# Patient Record
Sex: Male | Born: 1970 | Race: White | Hispanic: No | Marital: Single | Smoking: Former smoker
Health system: Southern US, Community
[De-identification: ages and names within clinical notes are randomized; demographics above are authoritative.]

## PROBLEM LIST (undated history)

## (undated) DIAGNOSIS — M7580 Other shoulder lesions, unspecified shoulder: Secondary | ICD-10-CM

## (undated) DIAGNOSIS — F419 Anxiety disorder, unspecified: Secondary | ICD-10-CM

## (undated) DIAGNOSIS — M419 Scoliosis, unspecified: Secondary | ICD-10-CM

## (undated) DIAGNOSIS — G4730 Sleep apnea, unspecified: Secondary | ICD-10-CM

## (undated) DIAGNOSIS — K219 Gastro-esophageal reflux disease without esophagitis: Secondary | ICD-10-CM

## (undated) DIAGNOSIS — M51379 Other intervertebral disc degeneration, lumbosacral region without mention of lumbar back pain or lower extremity pain: Secondary | ICD-10-CM

## (undated) DIAGNOSIS — K5900 Constipation, unspecified: Secondary | ICD-10-CM

## (undated) DIAGNOSIS — M5137 Other intervertebral disc degeneration, lumbosacral region: Secondary | ICD-10-CM

## (undated) DIAGNOSIS — M549 Dorsalgia, unspecified: Secondary | ICD-10-CM

## (undated) DIAGNOSIS — J329 Chronic sinusitis, unspecified: Secondary | ICD-10-CM

## (undated) DIAGNOSIS — J40 Bronchitis, not specified as acute or chronic: Secondary | ICD-10-CM

## (undated) HISTORY — DX: Constipation, unspecified: K59.00

## (undated) HISTORY — PX: NASAL SINUS SURGERY: SHX719

## (undated) HISTORY — PX: TRACHEOSTOMY: SUR1362

## (undated) HISTORY — DX: Dorsalgia, unspecified: M54.9

## (undated) HISTORY — PX: WISDOM TOOTH EXTRACTION: SHX21

## (undated) HISTORY — DX: Gastro-esophageal reflux disease without esophagitis: K21.9

## (undated) HISTORY — DX: Chronic sinusitis, unspecified: J32.9

## (undated) HISTORY — PX: TONGUE BASE REDUCTION SOMNOPLASTY: SHX2535

## (undated) HISTORY — PX: OTHER SURGICAL HISTORY: SHX169

---

## 2008-07-25 ENCOUNTER — Encounter
Admission: RE | Admit: 2008-07-25 | Discharge: 2008-07-25 | Payer: Self-pay | Admitting: Physical Medicine and Rehabilitation

## 2010-03-31 IMAGING — CT CT L SPINE W/ CM
3 of 7 series · 12 of 27 positions shown, 13 images · non-contrast
Comparison: none

CLINICAL DATA: Low back pain radiating into the right leg.
Degenerative disc disease L4-5 by MRI
TECHNIQUE: Multidetector CT imaging of the lumbar spine was
performed without intravenous contrast administration.  Multiplanar
CT image reconstructions were also generated.

[Series 3: bone windows · axial · 0.27mm/px · z∈[-358,-268]mm · 4 of 62 slices shown, 5 images]
[im 13/62  soft-tissue]
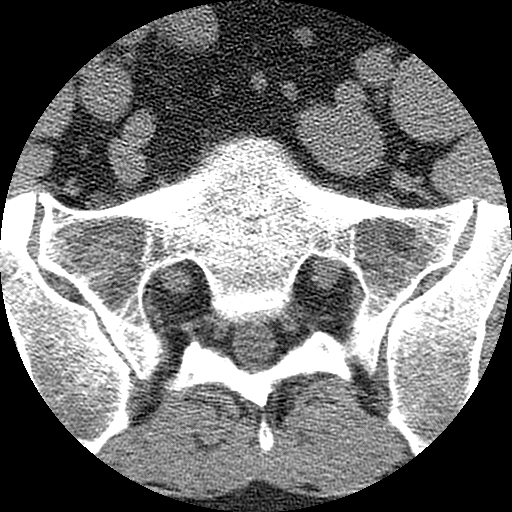
[im 13/62  bone]
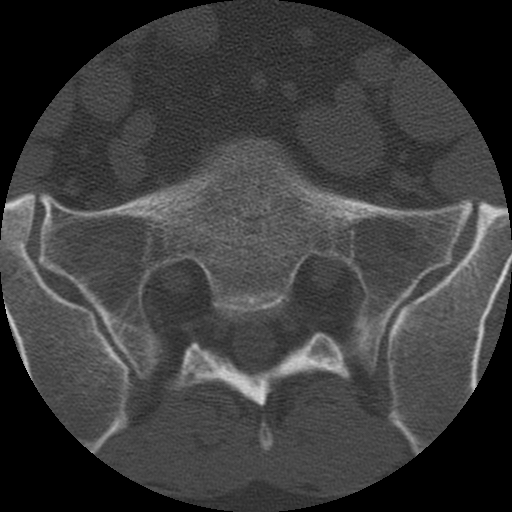
[im 25/62  bone]
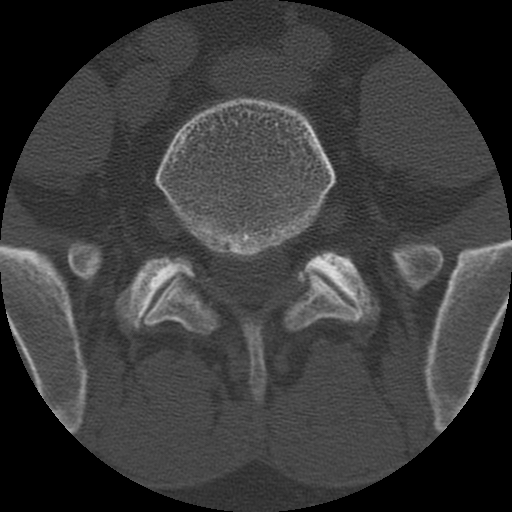
[im 37/62  bone]
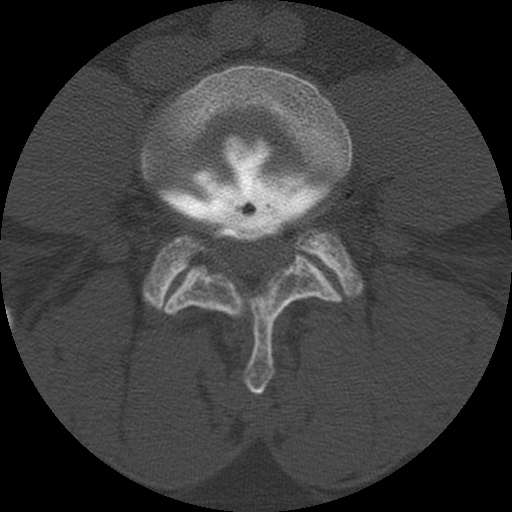
[im 49/62  bone]
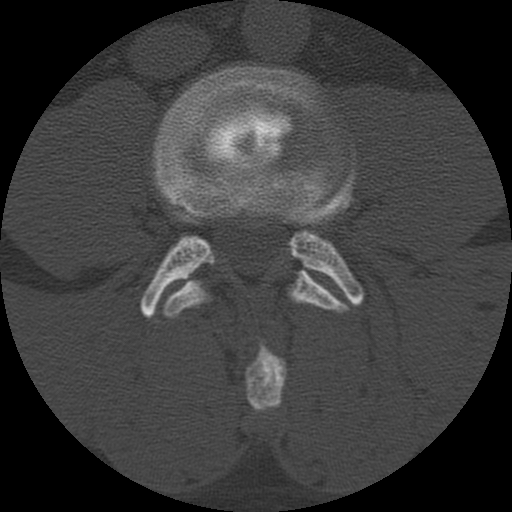

[Series 4: detail windows · axial · 0.27mm/px · z∈[-350,-276]mm · 3 of 62 slices shown]
[im 16/62  bone]
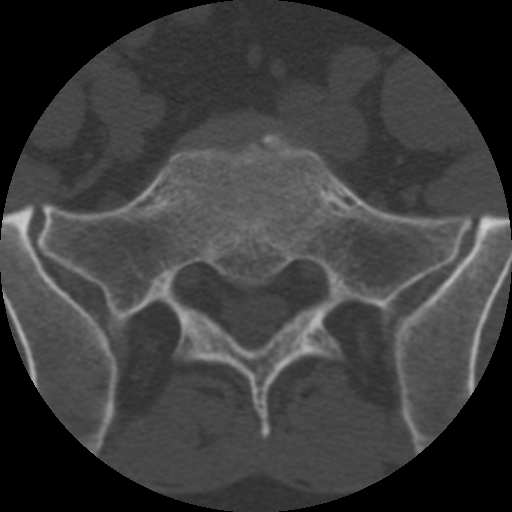
[im 31/62  bone]
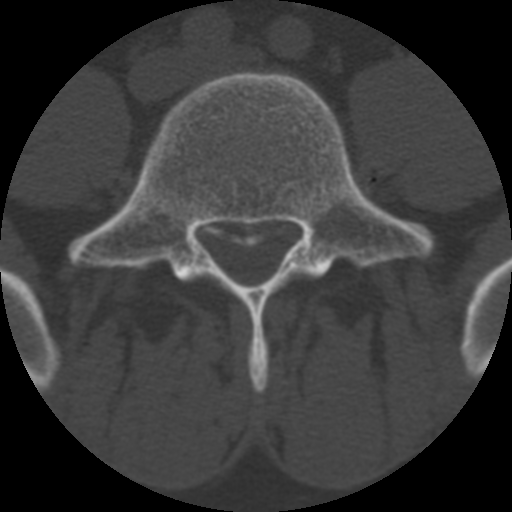
[im 46/62  bone]
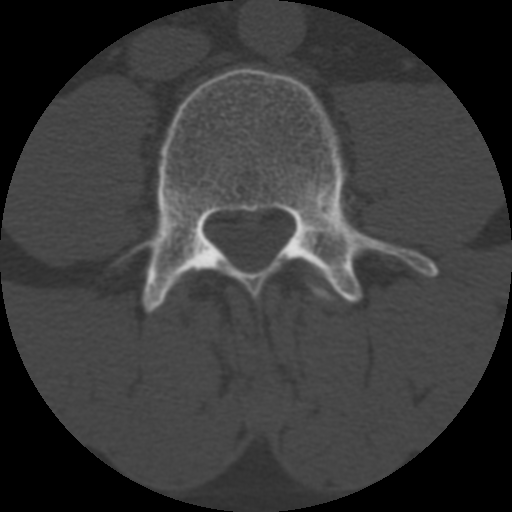

[Series 200: cor · coronal · 0.31mm/px · 5 of 40 slices shown]
[im 7/40  bone]
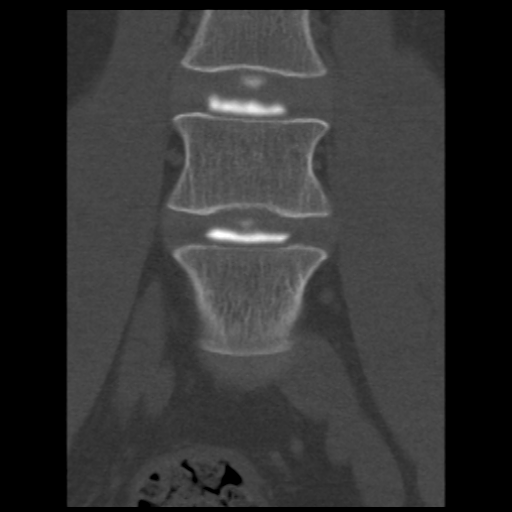
[im 14/40  bone]
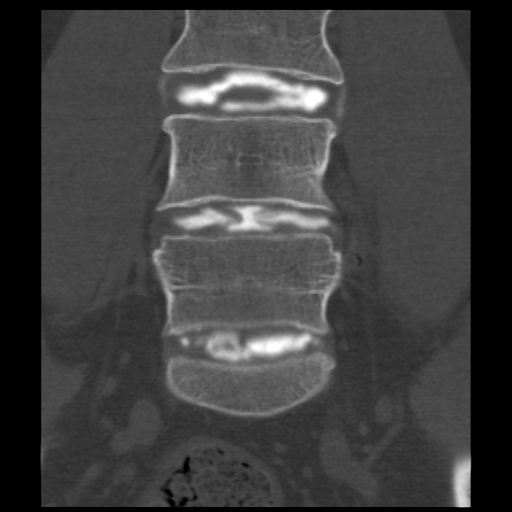
[im 20/40  bone]
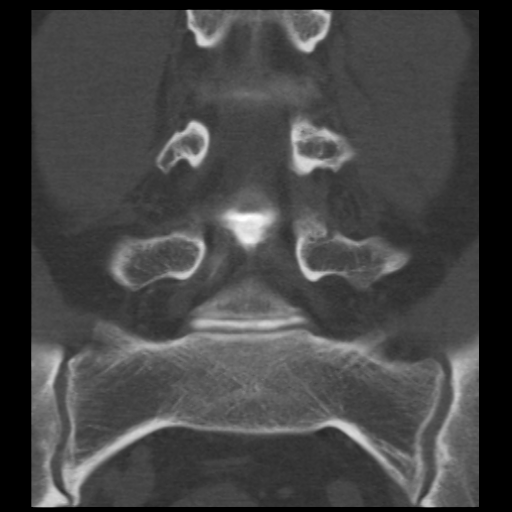
[im 27/40  bone]
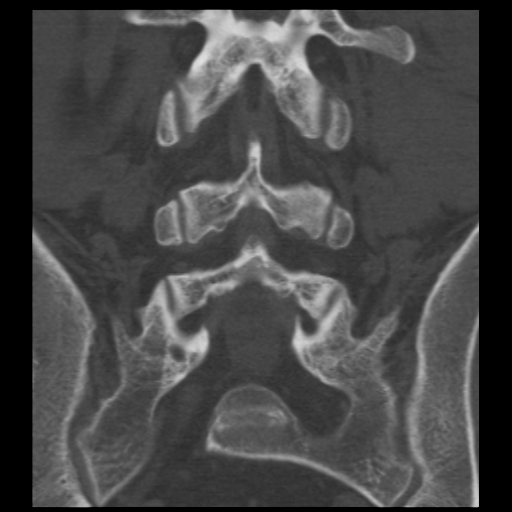
[im 33/40  bone]
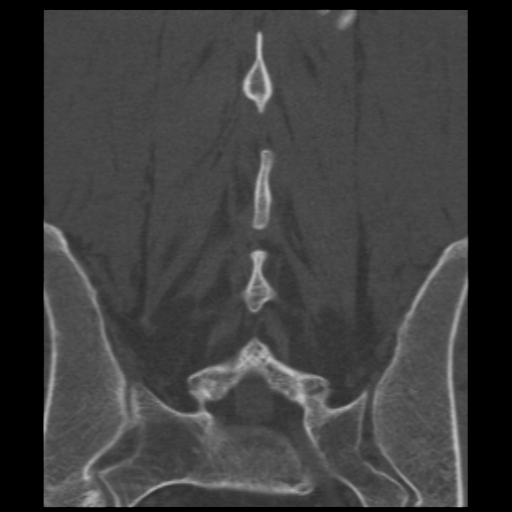

[12 of 27 positions shown; findings below may reference images not displayed]

Fluoroscopy Time: 5 minutes 23 seconds

DIAGNOSTIC LUMBAR DISK INJECTIONS

The procedure was discussed in depth with the patient including the
potential risk of infection. He received 1 gram of Ancef
intravenously prior to the procedure with a small amount withdrawn
and added to the contrast for injection.  He received to mg of
Versed intravenously prior to the procedure.  He was placed prone
on the fluoroscopic table.  A Betadine scrub of the low back was
performed and the patient was draped in a sterile fashion.  Skin
anesthesia was carried [DATE]% Lidocaine. Using a left sided
oblique approach, 15 cm 22 gauge [employer]check [employer] were directed into
the nuclear regions of the disks at L3-4, L4-5 and L5-S1.
Omnipaque 180 mixed with Ancef was injected using a pressure
monitoring syringe.  Spot radiographs were taken. The patient's
symptoms were recorded.  Following the procedure, hewas treated
with 60mg. intravenous Toradol and 944mcg. Intravenous Fentanyl.
He was taken to CT scan in good condition.

L3-4:  Opening pressure was 30 P S I.  The disc accommodated 2 ml
of contrast with elevation of intradiskal pressure to 100 P S I.
There is posterior annular tearing but no extrusion into the
epidural space.  The patient felt pressure but no familiar pain.

L5-S1:  Opening pressure was 40 P S I.  The disc accommodated 1 ml
of contrast with elevation of intradiskal pressure to 100 P S I.
There is posterior annular tearing.  There is mild pressure in the
left hip but no back for right leg pain.

L4-5:  Opening pressure was 10 P S I.  The disc accommodated 2 ml
of contrast with elevation of intradiskal pressure to 100 P S I.
There is posterior annular tearing.  The patient felt concordant
back pain. No leg pain.
IMPRESSION: Posterior annular tearing at all three levels.  Concordant symptoms
only at L4-5.  Familiar back pain but no pain extending into the
leg.

CT LUMBAR SPINE WITHOUT CONTRAST
FINDINGS: L3-4:  Annular tearing from 3 o'clock to 9 o'clock.  No
extrusion of injectate.  Mild bulging of the annulus.

L4-5:  Posterior annular tearing from 5 o'clock to 7 o'clock with
extrusion of injectate into the ventral epidural space.  This
indents the thecal sac.

L5-S1:  Posterior annular tearing from 5 o'clock to 7 o'clock.  No
gross extrusion into the ventral epidural space.  No apparent
compressive effect.
IMPRESSION: Posterior annular tearing at all three levels.  Extrusion of
injectate only at the L4-5 level.

## 2012-08-27 ENCOUNTER — Emergency Department (HOSPITAL_BASED_OUTPATIENT_CLINIC_OR_DEPARTMENT_OTHER)
Admission: EM | Admit: 2012-08-27 | Discharge: 2012-08-27 | Disposition: A | Discharge status: Home / Self Care | Payer: Medicare Other | Attending: Emergency Medicine | Admitting: Emergency Medicine

## 2012-08-27 ENCOUNTER — Encounter (HOSPITAL_BASED_OUTPATIENT_CLINIC_OR_DEPARTMENT_OTHER): Payer: Self-pay | Admitting: *Deleted

## 2012-08-27 DIAGNOSIS — F17200 Nicotine dependence, unspecified, uncomplicated: Secondary | ICD-10-CM | POA: Insufficient documentation

## 2012-08-27 DIAGNOSIS — R51 Headache: Secondary | ICD-10-CM

## 2012-08-27 DIAGNOSIS — J3489 Other specified disorders of nose and nasal sinuses: Secondary | ICD-10-CM

## 2012-08-27 DIAGNOSIS — Z79899 Other long term (current) drug therapy: Secondary | ICD-10-CM | POA: Insufficient documentation

## 2012-08-27 DIAGNOSIS — Z8669 Personal history of other diseases of the nervous system and sense organs: Secondary | ICD-10-CM | POA: Insufficient documentation

## 2012-08-27 DIAGNOSIS — Z8739 Personal history of other diseases of the musculoskeletal system and connective tissue: Secondary | ICD-10-CM | POA: Insufficient documentation

## 2012-08-27 HISTORY — DX: Other intervertebral disc degeneration, lumbosacral region: M51.37

## 2012-08-27 HISTORY — DX: Other intervertebral disc degeneration, lumbosacral region without mention of lumbar back pain or lower extremity pain: M51.379

## 2012-08-27 HISTORY — DX: Scoliosis, unspecified: M41.9

## 2012-08-27 HISTORY — DX: Sleep apnea, unspecified: G47.30

## 2012-08-27 HISTORY — DX: Other shoulder lesions, unspecified shoulder: M75.80

## 2012-08-27 MED ORDER — AZITHROMYCIN 250 MG PO TABS: ORAL_TABLET | ORAL | Status: DC

## 2012-08-27 NOTE — ED Provider Notes (Signed)
History  This chart was scribed for [name]. [name], MD by Shari Heritage, ED Scribe. The patient was seen in room MH06/MH06. Patient's care was started at 2240.  CSN: [id number]  Arrival date & time 08/27/12  2048   First MD Initiated Contact with Patient 08/27/12 2240      Chief Complaint  Patient presents with  . Neck Pain     The history is provided by the patient and a relative. No language interpreter was used.    HPI Comments: Joseph Rogers is a 42 y.o. male who presents to the Emergency Department complaining of sudden onset, improving, mild to moderate, right-sided neck pain that radiates to his head behind his right ear onset 1-2 hours ago. There is associated dizziness. Patient says that pain is aggravated by certain movement and is improved at rest. He also states that he feels a "knot" in his neck. Patient says that he was lying down watching football when the pain began. He states that he was feeling fine prior to this pain episode.  Patient's other symptoms at this time include postnasal drip and nonproductive cough. Patient states that he has bronchitis and is following up with his pulmonologist next week. Patient has been using Flonase upon recommendation from his pulmonologist, but he is not getting significant sinus relief. He has a surgical history of tracheostomy and medical history of sleep apnea. Patient says that he has had tongue tissue removed because his doctor believed it was contributing to sleep apnea.   Past Medical History  Diagnosis Date  . Sleep apnea   . DDD (degenerative disc disease), lumbosacral   . Scoliosis   . AC (acromioclavicular) joint bone spurs     Past Surgical History  Procedure Date  . Tracheostomy     History reviewed. No pertinent family history.  History  Substance Use Topics  . Smoking status: Current Every Day Smoker  . Smokeless tobacco: Not on file  . Alcohol Use: No      Review of Systems  Constitutional: Negative for  fever and chills.  HENT: Positive for ear pain, neck pain and postnasal drip. Negative for hearing loss, sore throat, trouble swallowing, neck stiffness, dental problem, voice change and tinnitus.   Respiratory: Positive for cough.   Cardiovascular: Negative for chest pain.  Gastrointestinal: Negative for nausea and vomiting.  Musculoskeletal: Negative for back pain.  Neurological: Positive for dizziness and headaches. Negative for syncope.    Allergies  Celebrex; Codeine; and Methadone  Home Medications   Current Outpatient Rx  Name  Route  Sig  Dispense  Refill  . DICLOFENAC POTASSIUM 50 MG PO TABS   Oral   Take 50 mg by mouth 3 (three) times daily.         Marland Kitchen FLUTICASONE PROPIONATE 50 MCG/ACT NA SUSP   Nasal   Place 2 sprays into the nose daily.         . OXYCODONE HCL 30 MG PO TABS   Oral   Take 30 mg by mouth every 4 (four) hours as needed.         Marland Kitchen RABEPRAZOLE SODIUM 20 MG PO TBEC   Oral   Take 20 mg by mouth daily.         . AZITHROMYCIN 250 MG PO TABS      Take 2 tablets by mouth on day 1, then one tablets by mouth daily on days 2-5   6 tablet   0     Triage Vitals:  BP 143/91  Pulse 92  Temp 97.9 F (36.6 C) (Oral)  Resp 18  Ht 6\' 3"  (1.905 m)  Wt 260 lb (117.935 kg)  BMI 32.50 kg/m2  SpO2 100%  Physical Exam  Constitutional: He is oriented to person, place, and time. He appears well-developed and well-nourished. No distress.  HENT:  Head: Normocephalic and atraumatic.  Right Ear: Hearing normal. Tympanic membrane is not injected.  Left Ear: Hearing normal. Tympanic membrane is not injected.  Nose: Rhinorrhea present. Right sinus exhibits maxillary sinus tenderness. Left sinus exhibits maxillary sinus tenderness.  Mouth/Throat: Uvula is midline, oropharynx is clear and moist and mucous membranes are normal.       Dry cerumen present in right ear.   Eyes: EOM are normal. Pupils are equal, round, and reactive to light.  Neck: Trachea normal,  normal range of motion and full passive range of motion without pain. Neck supple. No JVD present. No spinous process tenderness present. No Brudzinski's sign and no Kernig's sign noted. No mass present.  Cardiovascular: Normal rate and regular rhythm.   Pulmonary/Chest: Effort normal and breath sounds normal. No stridor.  Musculoskeletal: Normal range of motion.  Neurological: He is alert and oriented to person, place, and time. No cranial nerve deficit. Coordination normal.  Skin: Skin is warm and dry. He is not diaphoretic.  Psychiatric: He has a normal mood and affect. His behavior is normal.    ED Course  Procedures (including critical care time) DIAGNOSTIC STUDIES: Oxygen Saturation is 100% on room air, normal by my interpretation.    COORDINATION OF CARE: 10:50 PM- Patient informed of current plan for treatment and evaluation and agrees with plan at this time.      Labs Reviewed - No data to display No results found.   1. Headache   2. Rhinorrhea       MDM  I personally performed the services described in this documentation, which was scribed in my presence. The recorded information has been reviewed and is accurate.   Pt is well appearing, no fever, no stiff neck, no lymphadenopathy.  Endorses rhinorrhea.  Had HA, non focal neuro exam now.  Gait normal.  Pt given Z pak given his sig prior h/o sinus problems, allergies, HA, and throat difficulties.  Pt is in agreement with plan.  Told to return if worse, and to follow up with PCP next week.    [name]. [name], MD 08/27/12 2337

## 2012-08-27 NOTE — ED Notes (Signed)
Hx ear infection 3 weeks ago. Today, felt pain on right side of neck after going to feed the dogs. Feels knot. Pain radiates up into head. PERL

## 2012-08-27 NOTE — Discharge Instructions (Signed)
Sinus Headache A sinus headache is when your sinuses become clogged or swollen. Sinus headaches can range from mild to severe.  CAUSES A sinus headache can have different causes, such as:  Colds.  Sinus infections.  Allergies. SYMPTOMS  Symptoms of a sinus headache may vary and can include:  Headache.  Pain or pressure in the face.  Congested or runny nose.  Fever.  Inability to smell.  Pain in upper teeth. Weather changes can make symptoms worse. TREATMENT  The treatment of a sinus headache depends on the cause.  Sinus pain caused by a sinus infection may be treated with antibiotic medicine.  Sinus pain caused by allergies may be helped by allergy medicines (antihistamines) and medicated nasal sprays.  Sinus pain caused by congestion may be helped by flushing the nose and sinuses with saline solution. HOME CARE INSTRUCTIONS   If antibiotics are prescribed, take them as directed. Finish them even if you start to feel better.  Only take over-the-counter or prescription medicines for pain, discomfort, or fever as directed by your caregiver.  If you have congestion, use a nasal spray to help reduce pressure. SEEK IMMEDIATE MEDICAL CARE IF:  You have a fever.  You have headaches more than once a week.  You have sensitivity to light or sound.  You have repeated nausea and vomiting.  You have vision problems.  You have sudden, severe pain in your face or head.  You have a seizure.  You are confused.  Your sinus headaches do not get better after treatment. Many people think they have a sinus headache when they actually have migraines or tension headaches. MAKE SURE YOU:   Understand these instructions.  Will watch your condition.  Will get help right away if you are not doing well or get worse. Document Released: 09/10/2004 Document Revised: 10/26/2011 Document Reviewed: 11/01/2010 [employer] Patient Information 2013 [employer].  

## 2013-10-10 ENCOUNTER — Encounter (INDEPENDENT_AMBULATORY_CARE_PROVIDER_SITE_OTHER): Payer: Self-pay

## 2013-10-20 ENCOUNTER — Encounter (INDEPENDENT_AMBULATORY_CARE_PROVIDER_SITE_OTHER): Payer: Self-pay | Admitting: General Surgery

## 2013-10-20 ENCOUNTER — Ambulatory Visit (INDEPENDENT_AMBULATORY_CARE_PROVIDER_SITE_OTHER): Payer: Medicare Other | Admitting: General Surgery

## 2013-10-20 VITALS — BP 134/74 | HR 70 | Temp 98.8°F | Resp 16 | Ht 75.0 in | Wt 255.4 lb

## 2013-10-20 DIAGNOSIS — G8929 Other chronic pain: Secondary | ICD-10-CM

## 2013-10-20 DIAGNOSIS — G4733 Obstructive sleep apnea (adult) (pediatric): Secondary | ICD-10-CM

## 2013-10-20 DIAGNOSIS — G894 Chronic pain syndrome: Secondary | ICD-10-CM

## 2013-10-20 DIAGNOSIS — K4090 Unilateral inguinal hernia, without obstruction or gangrene, not specified as recurrent: Secondary | ICD-10-CM

## 2013-10-20 DIAGNOSIS — M545 Low back pain, unspecified: Secondary | ICD-10-CM

## 2013-10-20 DIAGNOSIS — K5900 Constipation, unspecified: Secondary | ICD-10-CM

## 2013-10-20 DIAGNOSIS — K5909 Other constipation: Secondary | ICD-10-CM

## 2013-10-20 NOTE — Patient Instructions (Signed)
You have a right inguinal hernia. Before repair, need to correct the constipation. I recommend adding one glass of warm prune juice a day, staying well hydrated, and taking MiraLAX once a day. At this to your current regimen. Once the constipation has improved, please call and we can discuss scheduling your right inguinal hernia repair.

## 2013-10-20 NOTE — Progress Notes (Signed)
Patient ID: Joseph PyleDavid Rogers, male   DOB: Mar 14, 1971, 43 y.o.   MRN: [id number]  Chief Complaint  Patient presents with  . New Evaluation    eval RIH / LIH    HPI Joseph Rogers is a 43 y.o. male.   HPI  He is referred by Arnette FeltsMike Duran, PA, for further evaluation of a right inguinal hernia.  He has chronic constipation which has been made worse by being on chronic oxycodone for chronic back pain. He has to strain to have a bowel movement. He has noticed a bulge in the right groin was drained and he is to push back in.  He reports having an ultrasound done which demonstrated a right inguinal hernia. He is here to discuss further treatment. He has tried laxatives for his constipation. When he is well-hydrated, they work. When he is dehydrated, he has a significant problem.  Past Medical History  Diagnosis Date  . Sleep apnea   . DDD (degenerative disc disease), lumbosacral   . Scoliosis   . AC (acromioclavicular) joint bone spurs   . Back pain   . Constipation   . Sinusitis   . GERD (gastroesophageal reflux disease)     Past Surgical History  Procedure Laterality Date  . Tracheostomy      Family History  Problem Relation Age of Onset  . Cancer Father     lung/mesotheliomia    Social History History  Substance Use Topics  . Smoking status: Current Every Day Smoker -- 0.50 packs/day  . Smokeless tobacco: Never Used  . Alcohol Use: No    Allergies  Allergen Reactions  . Celebrex [Celecoxib] Swelling  . Codeine Nausea And Vomiting  . Methadone Other (See Comments)    intolerance    Current Outpatient Prescriptions  Medication Sig Dispense Refill  . clonazePAM (KLONOPIN) 1 MG tablet       . oxycodone (ROXICODONE) 30 MG immediate release tablet Take 30 mg by mouth every 4 (four) hours as needed.       No current facility-administered medications for this visit.    Review of Systems Review of Systems  Constitutional: Negative.   Respiratory: Positive for apnea (He cannot  tolerate a CPAP machine.he sleeps on his side and does well with this.).   Cardiovascular: Negative.   Gastrointestinal: Positive for abdominal pain (Before bowel movement.) and constipation.  Genitourinary: Negative for difficulty urinating.  Musculoskeletal: Positive for arthralgias and back pain.  Neurological: Positive for headaches.    Blood pressure 134/74, pulse 70, temperature 98.8 F (37.1 C), temperature source Oral, resp. rate 16, height 6\' 3"  (1.905 m), weight 255 lb 6.4 oz (115.849 kg).  Physical Exam Physical Exam  Constitutional: No distress.  Overweight male.  HENT:  Head: Normocephalic and atraumatic.  Eyes: No scleral icterus.  Neck:  Lower midline scar  Cardiovascular: Normal rate and regular rhythm.   Pulmonary/Chest: Effort normal and breath sounds normal.  Abdominal: Soft. He exhibits no distension and no mass. There is no tenderness.  Genitourinary:  Right inguinal bulge that is reducible.  No testicular masses. Left inguinal floor it is solid.  Musculoskeletal: He exhibits no edema.  Neurological: He is alert.  Skin: Skin is warm and dry.  Psychiatric:  He is slightly anxious.    Data Reviewed None  Assessment    Right inguinal hernia brought about by worsening constipation. He is still struggling with the constipation. He is on chronic oxycodone which has been exacerbating the condition of constipation.  Plan    I have given him some recommendations regarding the constipation. Once he has this under control, I recommended an open right inguinal hernia repair with mesh with an overnight stay given his sleep apnea.  I have explained the procedure, risks, and aftercare of inguinal hernia repair.  Risks include but are not limited to bleeding, infection, wound problems, anesthesia, recurrence, bladder or intestine injury, urinary retention, testicular dysfunction, chronic pain, mesh problems.  He seems to understand.  I have asked him to call me  once his constipation has improved so we can schedule the surgery.        Jeraldine Primeau J 10/20/2013, 10:53 AM

## 2013-11-07 ENCOUNTER — Telehealth (INDEPENDENT_AMBULATORY_CARE_PROVIDER_SITE_OTHER): Payer: Self-pay

## 2013-11-07 ENCOUNTER — Other Ambulatory Visit (INDEPENDENT_AMBULATORY_CARE_PROVIDER_SITE_OTHER): Payer: Self-pay | Admitting: General Surgery

## 2013-11-07 NOTE — Telephone Encounter (Signed)
Pt made aware orders sent to surgery scheduling.  He is concerned about having to stay overnight for financial reasons.  He is on disability and his monthly income is barely enough to meet his expenses.  He does have a CPAP at home.  Is there any way overnight could be avoided?

## 2013-11-07 NOTE — Telephone Encounter (Signed)
LMOV.  Has his constipation resolved?  If so, we will schedule his surgery.  Dr. Abbey Chattersosenbower made aware.

## 2013-11-07 NOTE — Telephone Encounter (Signed)
If he has a responsible adult to stay with him, he could be an outpatient.

## 2013-11-08 NOTE — Telephone Encounter (Signed)
Pt made aware. He will have his mother and girlfriend staying with him.

## 2013-11-22 ENCOUNTER — Encounter (HOSPITAL_COMMUNITY): Payer: Self-pay | Admitting: Pharmacy Technician

## 2013-11-22 ENCOUNTER — Encounter (INDEPENDENT_AMBULATORY_CARE_PROVIDER_SITE_OTHER): Payer: Self-pay

## 2013-11-30 ENCOUNTER — Encounter (HOSPITAL_COMMUNITY)
Admission: RE | Admit: 2013-11-30 | Discharge: 2013-11-30 | Disposition: A | Discharge status: Home / Self Care | Payer: Medicare Other | Source: Ambulatory Visit | Attending: General Surgery | Admitting: General Surgery

## 2013-11-30 ENCOUNTER — Encounter (HOSPITAL_COMMUNITY): Payer: Self-pay

## 2013-11-30 DIAGNOSIS — Z01812 Encounter for preprocedural laboratory examination: Secondary | ICD-10-CM | POA: Insufficient documentation

## 2013-11-30 HISTORY — DX: Anxiety disorder, unspecified: F41.9

## 2013-11-30 HISTORY — DX: Bronchitis, not specified as acute or chronic: J40

## 2013-11-30 LAB — CBC WITH DIFFERENTIAL/PLATELET
BASOS PCT: 0 % (ref 0–1)
Basophils Absolute: 0 10*3/uL (ref 0.0–0.1)
EOS ABS: 0.2 10*3/uL (ref 0.0–0.7)
EOS PCT: 2 % (ref 0–5)
HCT: 43.8 % (ref 39.0–52.0)
HEMOGLOBIN: 15.2 g/dL (ref 13.0–17.0)
Lymphocytes Relative: 31 % (ref 12–46)
Lymphs Abs: 2.5 10*3/uL (ref 0.7–4.0)
MCH: 29.7 pg (ref 26.0–34.0)
MCHC: 34.7 g/dL (ref 30.0–36.0)
MCV: 85.7 fL (ref 78.0–100.0)
MONO ABS: 0.6 10*3/uL (ref 0.1–1.0)
MONOS PCT: 7 % (ref 3–12)
NEUTROS PCT: 60 % (ref 43–77)
Neutro Abs: 4.9 10*3/uL (ref 1.7–7.7)
Platelets: 175 10*3/uL (ref 150–400)
RBC: 5.11 MIL/uL (ref 4.22–5.81)
RDW: 13.8 % (ref 11.5–15.5)
WBC: 8.2 10*3/uL (ref 4.0–10.5)

## 2013-11-30 LAB — COMPREHENSIVE METABOLIC PANEL
ALBUMIN: 3.9 g/dL (ref 3.5–5.2)
ALT: 24 U/L (ref 0–53)
AST: 20 U/L (ref 0–37)
Alkaline Phosphatase: 71 U/L (ref 39–117)
BUN: 10 mg/dL (ref 6–23)
CALCIUM: 9 mg/dL (ref 8.4–10.5)
CO2: 26 mEq/L (ref 19–32)
CREATININE: 1.05 mg/dL (ref 0.50–1.35)
Chloride: 102 mEq/L (ref 96–112)
GFR calc non Af Amer: 85 mL/min — ABNORMAL LOW (ref >90)
Glucose, Bld: 96 mg/dL (ref 70–99)
POTASSIUM: 4.4 meq/L (ref 3.7–5.3)
Sodium: 141 mEq/L (ref 137–147)
TOTAL PROTEIN: 6.5 g/dL (ref 6.0–8.3)
Total Bilirubin: 0.3 mg/dL (ref 0.3–1.2)

## 2013-11-30 LAB — PROTIME-INR
INR: 1 (ref 0.00–1.49)
PROTHROMBIN TIME: 13 s (ref 11.6–15.2)

## 2013-11-30 NOTE — Pre-Procedure Instructions (Signed)
Joseph Rogers M Conrad  11/30/2013   Your procedure is scheduled on:  Thursday, 12/07/13  [facility] [address] at 9:30 AM.  Call this number if you have problems the morning of surgery: [phone]   Remember:   Do not eat food or drink liquids after midnight.   Take these medicines the morning of surgery with A SIP OF WATER: clonazepam (Klonopin), omeprazole (Prilosec), oxycodone  STOP taking Aspirin, Goody's, BC's, Aleve (Naproxen), Ibuprofen (Advil or Motrin), Fish Oil, Vitamins, Herbal Supplements or any substance that could thin your blood starting today 11/30/13.   Do not wear jewelry.  Do not wear lotions, powders, or colognes. You may wear deodorant.  Do not shave 48 hours prior to surgery. Men may shave face and neck.  Do not bring valuables to the hospital.  [facility] not responsible                  for any belongings or valuables.               Contacts, dentures or bridgework may not be worn into surgery.  Leave suitcase in the car. After surgery it may be brought to your room.  For patients admitted to the hospital, discharge time is determined by your                treatment team.               Patients discharged the day of surgery will not be allowed to drive  home.    Special Instructions: Please use CHG soap the night before surgery and the day of surgery. CHG soap should be used atleast twice.   Please read over the following fact sheets that you were given: Pain Booklet, Coughing and Deep Breathing and Surgical Site Infection Prevention

## 2013-12-04 NOTE — Progress Notes (Signed)
[facility] to send ekg, echo, cxr, last office visit notes to [phone] asap.

## 2013-12-06 MED ORDER — CEFAZOLIN SODIUM-DEXTROSE 2-3 GM-% IV SOLR
2.0000 g | INTRAVENOUS | Status: AC
  Administered 2013-12-07: 2 g via INTRAVENOUS
  Filled 2013-12-06: qty 50

## 2013-12-07 ENCOUNTER — Encounter (HOSPITAL_COMMUNITY): Payer: Medicare Other | Admitting: Anesthesiology

## 2013-12-07 ENCOUNTER — Encounter (HOSPITAL_COMMUNITY): Payer: Self-pay | Admitting: *Deleted

## 2013-12-07 ENCOUNTER — Ambulatory Visit (HOSPITAL_COMMUNITY)
Admission: RE | Admit: 2013-12-07 | Discharge: 2013-12-07 | Disposition: A | Discharge status: Home / Self Care | Payer: Medicare Other | Source: Ambulatory Visit | Attending: General Surgery | Admitting: General Surgery

## 2013-12-07 ENCOUNTER — Encounter (HOSPITAL_COMMUNITY)
Admission: RE | Disposition: A | Discharge status: Home / Self Care | Payer: Self-pay | Source: Ambulatory Visit | Attending: General Surgery

## 2013-12-07 ENCOUNTER — Ambulatory Visit (HOSPITAL_COMMUNITY): Payer: Medicare Other | Admitting: Anesthesiology

## 2013-12-07 DIAGNOSIS — K219 Gastro-esophageal reflux disease without esophagitis: Secondary | ICD-10-CM | POA: Insufficient documentation

## 2013-12-07 DIAGNOSIS — K4090 Unilateral inguinal hernia, without obstruction or gangrene, not specified as recurrent: Secondary | ICD-10-CM

## 2013-12-07 DIAGNOSIS — M4120 Other idiopathic scoliosis, site unspecified: Secondary | ICD-10-CM | POA: Insufficient documentation

## 2013-12-07 DIAGNOSIS — G4730 Sleep apnea, unspecified: Secondary | ICD-10-CM | POA: Insufficient documentation

## 2013-12-07 DIAGNOSIS — G8929 Other chronic pain: Secondary | ICD-10-CM | POA: Insufficient documentation

## 2013-12-07 DIAGNOSIS — F1920 Other psychoactive substance dependence, uncomplicated: Secondary | ICD-10-CM | POA: Insufficient documentation

## 2013-12-07 DIAGNOSIS — F17200 Nicotine dependence, unspecified, uncomplicated: Secondary | ICD-10-CM | POA: Insufficient documentation

## 2013-12-07 DIAGNOSIS — M5137 Other intervertebral disc degeneration, lumbosacral region: Secondary | ICD-10-CM | POA: Insufficient documentation

## 2013-12-07 DIAGNOSIS — M51379 Other intervertebral disc degeneration, lumbosacral region without mention of lumbar back pain or lower extremity pain: Secondary | ICD-10-CM | POA: Insufficient documentation

## 2013-12-07 DIAGNOSIS — F411 Generalized anxiety disorder: Secondary | ICD-10-CM | POA: Insufficient documentation

## 2013-12-07 HISTORY — PX: INGUINAL HERNIA REPAIR: SHX194

## 2013-12-07 HISTORY — PX: INSERTION OF MESH: SHX5868

## 2013-12-07 SURGERY — REPAIR, HERNIA, INGUINAL, ADULT
Anesthesia: Regional | Site: Groin | Laterality: Right

## 2013-12-07 MED ORDER — LIDOCAINE HCL (CARDIAC) 20 MG/ML IV SOLN
INTRAVENOUS | Status: AC
  Filled 2013-12-07: qty 5

## 2013-12-07 MED ORDER — FENTANYL CITRATE 0.05 MG/ML IJ SOLN
INTRAMUSCULAR | Status: AC
  Filled 2013-12-07: qty 5

## 2013-12-07 MED ORDER — LACTATED RINGERS IV SOLN
INTRAVENOUS | Status: DC | PRN
  Administered 2013-12-07 (×2): via INTRAVENOUS

## 2013-12-07 MED ORDER — SUCCINYLCHOLINE CHLORIDE 20 MG/ML IJ SOLN
INTRAMUSCULAR | Status: DC | PRN
  Administered 2013-12-07: 100 mg via INTRAVENOUS

## 2013-12-07 MED ORDER — LIDOCAINE HCL (CARDIAC) 20 MG/ML IV SOLN
INTRAVENOUS | Status: DC | PRN
Start: 2013-12-07 — End: 2013-12-07
  Administered 2013-12-07: 80 mg via INTRAVENOUS

## 2013-12-07 MED ORDER — MIDAZOLAM HCL 2 MG/2ML IJ SOLN
INTRAMUSCULAR | Status: AC
  Administered 2013-12-07: 2 mg
  Filled 2013-12-07: qty 2

## 2013-12-07 MED ORDER — KETOROLAC TROMETHAMINE 30 MG/ML IJ SOLN
INTRAMUSCULAR | Status: AC
  Filled 2013-12-07: qty 1

## 2013-12-07 MED ORDER — LACTATED RINGERS IV SOLN
INTRAVENOUS | Status: DC
  Administered 2013-12-07: 10:00:00 via INTRAVENOUS

## 2013-12-07 MED ORDER — PROMETHAZINE HCL 25 MG/ML IJ SOLN: 6.2500 mg | INTRAMUSCULAR | Status: DC | PRN

## 2013-12-07 MED ORDER — OXYCODONE HCL 5 MG PO TABS: 5.0000 mg | ORAL_TABLET | ORAL | Status: AC | PRN

## 2013-12-07 MED ORDER — PROPOFOL 10 MG/ML IV BOLUS
INTRAVENOUS | Status: AC
  Filled 2013-12-07: qty 20

## 2013-12-07 MED ORDER — HYDROMORPHONE HCL PF 1 MG/ML IJ SOLN: 0.2500 mg | INTRAMUSCULAR | Status: DC | PRN

## 2013-12-07 MED ORDER — KETOROLAC TROMETHAMINE 30 MG/ML IJ SOLN
15.0000 mg | Freq: Once | INTRAMUSCULAR | Status: AC | PRN
  Administered 2013-12-07: 15 mg via INTRAVENOUS

## 2013-12-07 MED ORDER — OXYCODONE HCL 5 MG PO TABS
5.0000 mg | ORAL_TABLET | Freq: Once | ORAL | Status: AC | PRN
  Administered 2013-12-07: 5 mg via ORAL

## 2013-12-07 MED ORDER — PROPOFOL 10 MG/ML IV BOLUS
INTRAVENOUS | Status: DC | PRN
  Administered 2013-12-07: 200 mg via INTRAVENOUS

## 2013-12-07 MED ORDER — KETAMINE HCL 10 MG/ML IJ SOLN
INTRAMUSCULAR | Status: DC | PRN
  Administered 2013-12-07: 50 mg via INTRAVENOUS

## 2013-12-07 MED ORDER — OXYCODONE HCL 5 MG/5ML PO SOLN: 5.0000 mg | Freq: Once | ORAL | Status: AC | PRN

## 2013-12-07 MED ORDER — ONDANSETRON HCL 4 MG/2ML IJ SOLN
INTRAMUSCULAR | Status: DC | PRN
  Administered 2013-12-07: 4 mg via INTRAVENOUS

## 2013-12-07 MED ORDER — 0.9 % SODIUM CHLORIDE (POUR BTL) OPTIME
TOPICAL | Status: DC | PRN
  Administered 2013-12-07: 1000 mL

## 2013-12-07 MED ORDER — MIDAZOLAM HCL 2 MG/2ML IJ SOLN
INTRAMUSCULAR | Status: AC
  Filled 2013-12-07: qty 2

## 2013-12-07 MED ORDER — ARTIFICIAL TEARS OP OINT
TOPICAL_OINTMENT | OPHTHALMIC | Status: AC
  Filled 2013-12-07: qty 3.5

## 2013-12-07 MED ORDER — ACETAMINOPHEN 160 MG/5ML PO SOLN
325.0000 mg | ORAL | Status: DC | PRN
  Filled 2013-12-07: qty 20.3

## 2013-12-07 MED ORDER — KETAMINE HCL 100 MG/ML IJ SOLN
INTRAMUSCULAR | Status: AC
  Filled 2013-12-07: qty 1

## 2013-12-07 MED ORDER — SUCCINYLCHOLINE CHLORIDE 20 MG/ML IJ SOLN
INTRAMUSCULAR | Status: AC
  Filled 2013-12-07: qty 1

## 2013-12-07 MED ORDER — FENTANYL CITRATE 0.05 MG/ML IJ SOLN
INTRAMUSCULAR | Status: DC | PRN
  Administered 2013-12-07 (×2): 50 ug via INTRAVENOUS

## 2013-12-07 MED ORDER — ONDANSETRON HCL 4 MG/2ML IJ SOLN
INTRAMUSCULAR | Status: AC
  Filled 2013-12-07: qty 2

## 2013-12-07 MED ORDER — MIDAZOLAM HCL 5 MG/5ML IJ SOLN
INTRAMUSCULAR | Status: DC | PRN
  Administered 2013-12-07: 2 mg via INTRAVENOUS

## 2013-12-07 MED ORDER — ACETAMINOPHEN 325 MG PO TABS: 325.0000 mg | ORAL_TABLET | ORAL | Status: DC | PRN

## 2013-12-07 MED ORDER — OXYCODONE HCL 5 MG PO TABS
ORAL_TABLET | ORAL | Status: AC
  Filled 2013-12-07: qty 1

## 2013-12-07 MED ORDER — FENTANYL CITRATE 0.05 MG/ML IJ SOLN
INTRAMUSCULAR | Status: AC
  Administered 2013-12-07: 100 ug
  Filled 2013-12-07: qty 2

## 2013-12-07 MED ORDER — BUPIVACAINE 0.5 % ON-Q PUMP SINGLE CATH 100 ML
100.0000 mL | INJECTION | Status: DC
  Filled 2013-12-07: qty 100

## 2013-12-07 MED ORDER — BUPIVACAINE 0.5 % ON-Q PUMP SINGLE CATH 100 ML
INJECTION | Status: DC | PRN
  Administered 2013-12-07: 100 mL

## 2013-12-07 MED ORDER — ARTIFICIAL TEARS OP OINT
TOPICAL_OINTMENT | OPHTHALMIC | Status: DC | PRN
  Administered 2013-12-07: 1 via OPHTHALMIC

## 2013-12-07 MED ORDER — BUPIVACAINE-EPINEPHRINE 0.5% -1:200000 IJ SOLN
INTRAMUSCULAR | Status: DC | PRN
  Administered 2013-12-07: 30 mL

## 2013-12-07 MED ORDER — BUPIVACAINE-EPINEPHRINE (PF) 0.5% -1:200000 IJ SOLN
INTRAMUSCULAR | Status: AC
  Filled 2013-12-07: qty 30

## 2013-12-07 SURGICAL SUPPLY — 49 items
BENZOIN TINCTURE PRP APPL 2/3 (GAUZE/BANDAGES/DRESSINGS) ×3 IMPLANT
BLADE 10 SAFETY STRL DISP (BLADE) ×3 IMPLANT
BLADE 15 SAFETY STRL DISP (BLADE) ×3 IMPLANT
BLADE SURG ROTATE 9660 (MISCELLANEOUS) IMPLANT
CATH KIT ON Q 2.5IN SLV (PAIN MANAGEMENT) ×3 IMPLANT
CHLORAPREP W/TINT 26ML (MISCELLANEOUS) ×3 IMPLANT
CLOSURE WOUND 1/2 X4 (GAUZE/BANDAGES/DRESSINGS) ×1
COVER SURGICAL LIGHT HANDLE (MISCELLANEOUS) ×3 IMPLANT
DRAIN PENROSE 1/2X12 LTX STRL (WOUND CARE) ×3 IMPLANT
DRAPE INCISE IOBAN 66X45 STRL (DRAPES) ×3 IMPLANT
DRAPE LAPAROTOMY TRNSV 102X78 (DRAPE) ×3 IMPLANT
DRAPE UTILITY 15X26 W/TAPE STR (DRAPE) ×6 IMPLANT
DRESSING TELFA 8X3 (GAUZE/BANDAGES/DRESSINGS) ×3 IMPLANT
DRSG OPSITE 6X11 MED (GAUZE/BANDAGES/DRESSINGS) ×3 IMPLANT
ELECT CAUTERY BLADE 6.4 (BLADE) ×3 IMPLANT
ELECT REM PT RETURN 9FT ADLT (ELECTROSURGICAL) ×3
ELECTRODE REM PT RTRN 9FT ADLT (ELECTROSURGICAL) ×1 IMPLANT
GLOVE BIOGEL PI IND STRL 6.5 (GLOVE) ×1 IMPLANT
GLOVE BIOGEL PI IND STRL 8 (GLOVE) ×1 IMPLANT
GLOVE BIOGEL PI INDICATOR 6.5 (GLOVE) ×2
GLOVE BIOGEL PI INDICATOR 8 (GLOVE) ×2
GLOVE ECLIPSE 8.0 STRL XLNG CF (GLOVE) ×3 IMPLANT
GLOVE SS BIOGEL STRL SZ 6.5 (GLOVE) ×1 IMPLANT
GLOVE SUPERSENSE BIOGEL SZ 6.5 (GLOVE) ×2
GLOVE SURG SS PI 6.5 STRL IVOR (GLOVE) ×6 IMPLANT
GOWN STRL REUS W/ TWL LRG LVL3 (GOWN DISPOSABLE) ×3 IMPLANT
GOWN STRL REUS W/TWL LRG LVL3 (GOWN DISPOSABLE) ×6
KIT BASIN OR (CUSTOM PROCEDURE TRAY) ×3 IMPLANT
KIT ROOM TURNOVER OR (KITS) ×3 IMPLANT
MESH HERNIA 3X6 (Mesh General) ×3 IMPLANT
NEEDLE HYPO 25GX1X1/2 BEV (NEEDLE) ×3 IMPLANT
NS IRRIG 1000ML POUR BTL (IV SOLUTION) ×3 IMPLANT
PACK SURGICAL SETUP 50X90 (CUSTOM PROCEDURE TRAY) ×3 IMPLANT
PAD ARMBOARD 7.5X6 YLW CONV (MISCELLANEOUS) ×3 IMPLANT
PENCIL BUTTON HOLSTER BLD 10FT (ELECTRODE) ×3 IMPLANT
SPECIMEN JAR SMALL (MISCELLANEOUS) ×3 IMPLANT
SPONGE GAUZE 4X4 12PLY (GAUZE/BANDAGES/DRESSINGS) ×3 IMPLANT
SPONGE LAP 18X18 X RAY DECT (DISPOSABLE) ×3 IMPLANT
STRIP CLOSURE SKIN 1/2X4 (GAUZE/BANDAGES/DRESSINGS) ×2 IMPLANT
SUT MON AB 4-0 PC3 18 (SUTURE) ×3 IMPLANT
SUT PROLENE 2 0 CT2 30 (SUTURE) ×6 IMPLANT
SUT SILK 2 0 SH (SUTURE) IMPLANT
SUT VIC AB 2-0 SH 18 (SUTURE) ×3 IMPLANT
SUT VIC AB 3-0 SH 27 (SUTURE) ×2
SUT VIC AB 3-0 SH 27XBRD (SUTURE) ×1 IMPLANT
SUT VICRYL AB 3 0 TIES (SUTURE) ×3 IMPLANT
SYR CONTROL 10ML LL (SYRINGE) ×3 IMPLANT
TOWEL OR 17X24 6PK STRL BLUE (TOWEL DISPOSABLE) ×3 IMPLANT
TOWEL OR 17X26 10 PK STRL BLUE (TOWEL DISPOSABLE) ×3 IMPLANT

## 2013-12-07 NOTE — Op Note (Signed)
Preoperative diagnosis:  Right inguinal hernia.  Postoperative diagnosis:  Same  Procedure:  Right inguinal hernia repair with mesh and placement of On Q pain pump  Surgeon:  Avel Peaceodd Kayin Kettering, M.D.  Anesthesia:  General with local (Marcaine).  Indication:  This is a 43 year old male who has right groin pain and a right inguinal hernia. He now presents for repair. The procedure, risks, and after care were discussed with him preoperatively.  Technique:  He was seen in the holding room and the right groin was marked with my initials. He was brought to the operating, placed supine on the operating table, and the anesthetic was administered by the anesthesiologist. The hair in the groin area was clipped as was felt to be necessary. This area was then sterilely prepped and draped.  Local anesthetic was infiltrated in the superficial and deep tissues in the right groin.  An incision was made through the skin and subcutaneous tissue until the external oblique aponeurosis was identified.  Local anesthetic was infiltrated deep to the external oblique aponeurosis. The external oblique aponeurosis was divided through the external ring medially and back toward the anterior superior iliac spine laterally. Using blunt dissection, the shelving edge of the inguinal ligament was identified inferiorly and the internal oblique aponeurosis and muscle were identified superiorly. The ilioinguinal nerve was identified and preserved.  The spermatic cord was isolated and a posterior window was made around it. He had both a direct and indirect hernia defect with extraperitoneal fat herniating through the defects. I excised the extraperitoneal fat herniating through the indirect defect. I reduced the direct defect extraperitoneal fat and loosely approximated the attenuated transversalis fascia muscle to keep the hernia contents reduced.   A piece of 3" x 6" polypropylene mesh was brought into the field and anchored 1-2 cm  medial to the pubic tubercle with 2-0 Prolene suture. The inferior aspect of the mesh was anchored to the shelving edge of the inguinal ligament with running 2-0 Prolene suture to a level 1-2 cm lateral to the internal ring. A slit was cut in the mesh creating 2 tails. These were wrapped around the spermatic cord. The superior aspect of the mesh was anchored to the internal oblique aponeurosis and muscle with interrupted 2-0 Vicryl sutures. The 2 tails of the mesh were then crossed creating a new internal ring and were anchored to the shelving edge of the inguinal ligament with 2-0 Prolene suture. The tip of a hemostat could be placed through the new aperture. The lateral aspect of the mesh was then tucked deep to the external oblique aponeurosis.  The On-Q catheter was then placed through a puncture wound lateral to the large incision so that it would lie on the mesh. It was primed with 0.5%  plain Marcaine solution.  The wound was inspected and hemostasis was adequate. The external oblique aponeurosis was then closed over the mesh and cord with running 3-0 Vicryl suture. The subcutaneous tissue was closed with running 3-0 Vicryl suture. The skin closed with a running 4-0 Monocryl subcuticular stitch.  Steri-Strips and a sterile dressing were applied.  The On-Q catheter was hooked up to the reservoir.  The procedure was well-tolerated without any apparent complications and he was taken to the recovery room in satisfactory condition.

## 2013-12-07 NOTE — H&P (Signed)
Joseph Rogers is an 43 y.o. male.   Chief Complaint:   Here for  Right inguinal hernia (RIH) repair HPI: He has a symptomatic RIH and now presents for elective repair.  Past Medical History  Diagnosis Date  . Sleep apnea   . DDD (degenerative disc disease), lumbosacral   . Scoliosis   . AC (acromioclavicular) joint bone spurs   . Back pain   . Constipation   . Sinusitis   . GERD (gastroesophageal reflux disease)   . Anxiety   . Bronchitis     Pt reports bronchitis after using CPAP.    Past Surgical History  Procedure Laterality Date  . Tracheostomy    . Wisdom tooth extraction    . Nasal sinus surgery    . Tongue base reduction somnoplasty      also removed uvula along with soft tissue, pt has trach for 6 months folllowing surgery.  . Steroid epidural shots    . Ficet injections      Family History  Problem Relation Age of Onset  . Cancer Father     lung/mesotheliomia   Social History:  reports that he has been smoking Cigarettes.  He has been smoking about 0.50 packs per day. He has never used smokeless tobacco. He reports that he does not drink alcohol or use illicit drugs.  Allergies:  Allergies  Allergen Reactions  . Bee Venom Itching and Swelling  . Celebrex [Celecoxib] Swelling  . Codeine Nausea And Vomiting  . Methadone Other (See Comments)    Intolerance; blisters on face and chest    Medications Prior to Admission  Medication Sig Dispense Refill  . clonazePAM (KLONOPIN) 1 MG tablet Take 0.5-1 mg by mouth 2 (two) times daily as needed for anxiety.      Marland Kitchen. omeprazole (PRILOSEC) 20 MG capsule Take 20 mg by mouth daily.      . Oxycodone HCl 20 MG TABS Take 20 mg by mouth 5 (five) times daily.      . polyethylene glycol (MIRALAX / GLYCOLAX) packet Take 34 g by mouth every evening.      . senna (SENOKOT) 8.6 MG TABS tablet Take 4 tablets by mouth every evening.        No results found for this or any previous visit (from the past 48 hour(s)). No results  found.  Review of Systems  Respiratory: Negative.   Cardiovascular: Negative.   Gastrointestinal: Positive for constipation. Negative for abdominal pain.    Blood pressure 141/83, pulse 76, temperature 97.9 F (36.6 C), temperature source Oral, resp. rate 20, weight 250 lb (113.399 kg), SpO2 97.00%. Physical Exam  Constitutional: No distress.  Overweight  HENT:  Head: Normocephalic and atraumatic.  GI: Soft. He exhibits no mass.  Genitourinary:  Reducible right inguinal bulge.     Assessment/Plan Right inguinal hernia  Plan:  Repair of hernia with mesh and placement of On Q pump.  Jim Desanctisodd J Sharley Keeler 12/07/2013, 11:36 AM

## 2013-12-07 NOTE — Anesthesia Procedure Notes (Addendum)
Anesthesia Regional Block:  TAP block  Pre-Anesthetic Checklist: ,, timeout performed, Correct Patient, Correct Site, Correct Laterality, Correct Procedure, Correct Position, risks and benefits discussed, surgical consent, pre-op evaluation,  At surgeon's request and post-op pain management  Laterality: N/A and Right  Prep: chloraprep       Needles:  Injection technique: Single-shot  Needle Type: Echogenic Needle          Additional Needles:  Procedures: ultrasound guided (picture in chart) TAP block Narrative:  Start time: 12/07/2013 11:12 AM End time: 12/07/2013 11:25 AM Injection made incrementally with aspirations every 5 mL.  Performed by: Personally  Anesthesiologist: Moser  Additional Notes: H+P and labs reviewed, risks and benefits discussed with patient, procedure tolerated well without complications   Procedure Name: Intubation Date/Time: 12/07/2013 12:06 PM Performed by: Donette LarryMEYER, Hayzel Ruberg E Pre-anesthesia Checklist: Patient identified, Emergency Drugs available, Suction available, Patient being monitored and Timeout performed Patient Re-evaluated:Patient Re-evaluated prior to inductionOxygen Delivery Method: Circle system utilized Preoxygenation: Pre-oxygenation with 100% oxygen Intubation Type: IV induction Ventilation: Mask ventilation without difficulty Laryngoscope Size: Miller and 3 Grade View: Grade II Tube type: Oral Tube size: 7.5 mm Number of attempts: 1 Airway Equipment and Method: Stylet Secured at: 23 cm Tube secured with: Tape Dental Injury: Teeth and Oropharynx as per pre-operative assessment

## 2013-12-07 NOTE — Transfer of Care (Signed)
Immediate Anesthesia Transfer of Care Note  Patient: [place]  Procedure(s) Performed: Procedure(s): RIGHT INGUINAL HERNIA REPAIR  (Right) INSERTION OF MESH (Right)  Patient Location: PACU  Anesthesia Type:General and Regional  Level of Consciousness: awake, alert , oriented and sedated  Airway & Oxygen Therapy: Patient Spontanous Breathing and Patient connected to nasal cannula oxygen  Post-op Assessment: Report given to PACU RN, Post -op Vital signs reviewed and stable and Patient moving all extremities  Post vital signs: Reviewed and stable  Complications: No apparent anesthesia complications

## 2013-12-07 NOTE — Discharge Instructions (Signed)
[facility] _______[facility]  UMBILICAL OR INGUINAL HERNIA REPAIR: POST OP INSTRUCTIONS  Always review your discharge instruction sheet given to you by the facility where your surgery was performed. IF YOU HAVE DISABILITY OR FAMILY LEAVE FORMS, YOU MUST BRING THEM TO THE OFFICE FOR PROCESSING.   DO NOT GIVE THEM TO YOUR DOCTOR.  1. A  prescription for pain medication may be given to you upon discharge.  Take your pain medication as prescribed, if needed.  If narcotic pain medicine is not needed, then you may take acetaminophen (Tylenol) or ibuprofen (Advil) as needed. 2. Take your usually prescribed medications unless otherwise directed. 3. If you need a refill on your pain medication, please contact your pharmacy.  They will contact our office to request authorization. Prescriptions will not be filled after 5 pm or on week-ends. 4. You should follow a light diet the first 24 hours after arrival home, such as soup and crackers, etc.  Be sure to include lots of fluids daily.  Resume your normal diet the day after surgery. 5. Most patients will experience some swelling and bruising around the umbilicus or in the groin and scrotum.  Ice packs and reclining will help.  Swelling and bruising can take several days to resolve.  6. It is common to experience some constipation if taking pain medication after surgery.  Increasing fluid intake and taking a stool softener (such as Colace) will usually help or prevent this problem from occurring.  A mild laxative (Milk of Magnesia or Miralax) should be taken according to package directions if there are no bowel movements after 48 hours. 7. Unless discharge instructions indicate otherwise, you may remove the pain pump catheter and bandages 4/25 at 5:00 pm, and you may shower at that time.  You may have steri-strips (small skin tapes) in place directly over the incision.  These strips should be left on the skin.  If your surgeon used skin glue on the  incision, you may shower in 24 hours.  The glue will flake off over the next 2-3 weeks.  Any sutures or staples will be removed at the office during your follow-up visit. 8. ACTIVITIES:  You may resume regular (light) daily activities beginning the next day--such as daily self-care, walking, climbing stairs--gradually increasing activities as tolerated.  You may have sexual intercourse when it is comfortable.  Refrain from any heavy lifting or straining (nothing over 10 pounds for 6 weeks). a. You may drive when you are no longer taking prescription pain medication, you can comfortably wear a seatbelt, and you can safely maneuver your car and apply brakes. b. RETURN TO WORK:  _Desk work only in one week.________________________________________________________ 9. You should see your doctor in the office for a follow-up appointment approximately 2-3 weeks after your surgery.  Make sure that you call for this appointment within a day or two after you arrive home to insure a convenient appointment time. 10. OTHER INSTRUCTIONS:  __________________________________________________________________________________________________________________________________________________________________________________________  WHEN TO CALL YOUR DOCTOR: 1. Fever over 101.0 2. Inability to urinate 3. Nausea and/or vomiting 4. Extreme swelling or bruising 5. Continued bleeding from incision. 6. Increased pain, redness, or drainage from the incision  The clinic staff is available to answer your questions during regular business hours.  Please dont hesitate to call and ask to speak to one of the nurses for clinical concerns.  If you have a medical emergency, go to the nearest emergency room or call 911.  A surgeon from [facility] is  always on call at the hospital   [address] [place], [place], [place]  [address] ?  [address], [place],    [address] [phone] ? [phone] ? FAX  [phone] Web site: [email or web address]

## 2013-12-07 NOTE — Anesthesia Preprocedure Evaluation (Signed)
Anesthesia Evaluation  Patient identified by MRN, date of birth, ID band Patient awake    Reviewed: Allergy & Precautions, H&P , NPO status , Patient's Chart, lab work & pertinent test results  History of Anesthesia Complications Negative for: history of anesthetic complications  Airway Mallampati: II TM Distance: >3 FB Neck ROM: Full   Comment: Previous healed trach site Dental  (+) Teeth Intact   Pulmonary sleep apnea and Continuous Positive Airway Pressure Ventilation , neg COPDCurrent Smoker,  breath sounds clear to auscultation        Cardiovascular negative cardio ROS  Rhythm:Regular Rate:Normal     Neuro/Psych PSYCHIATRIC DISORDERS Anxiety Chronic neck and back pain, narcotic dependent    GI/Hepatic Neg liver ROS, GERD-  Medicated and Controlled,  Endo/Other  negative endocrine ROS  Renal/GU negative Renal ROS     Musculoskeletal negative musculoskeletal ROS (+)   Abdominal   Peds  Hematology negative hematology ROS (+)   Anesthesia Other Findings   Reproductive/Obstetrics negative OB ROS                           Anesthesia Physical Anesthesia Plan  ASA: III  Anesthesia Plan: General and Regional   Post-op Pain Management:    Induction: Intravenous  Airway Management Planned: Oral ETT  Additional Equipment: None  Intra-op Plan:   Post-operative Plan: Extubation in OR  Informed Consent: I have reviewed the patients History and Physical, chart, labs and discussed the procedure including the risks, benefits and alternatives for the proposed anesthesia with the patient or authorized representative who has indicated his/her understanding and acceptance.   Dental advisory given  Plan Discussed with: CRNA and Surgeon  Anesthesia Plan Comments:         Anesthesia Quick Evaluation

## 2013-12-11 ENCOUNTER — Telehealth (INDEPENDENT_AMBULATORY_CARE_PROVIDER_SITE_OTHER): Payer: Self-pay | Admitting: General Surgery

## 2013-12-11 MED ORDER — ROPIVACAINE HCL 5 MG/ML IJ SOLN
INTRAMUSCULAR | Status: DC | PRN
  Administered 2013-12-07: 30 mL via PERINEURAL

## 2013-12-11 NOTE — Telephone Encounter (Signed)
Please let him know that his lab work is normal.

## 2013-12-11 NOTE — Anesthesia Postprocedure Evaluation (Signed)
  Anesthesia Post-op Note  Patient: Joseph [place]  Procedure(s) Performed: Procedure(s): RIGHT INGUINAL HERNIA REPAIR  (Right) INSERTION OF MESH (Right)  Patient Location: PACU  Anesthesia Type:GA combined with regional for post-op pain  Level of Consciousness: awake and alert   Airway and Oxygen Therapy: Patient Spontanous Breathing and Patient connected to nasal cannula oxygen  Post-op Pain: none  Post-op Assessment: Post-op Vital signs reviewed, Patient's Cardiovascular Status Stable, Respiratory Function Stable, Patent Airway, No signs of Nausea or vomiting and Pain level controlled  Post-op Vital Signs: Reviewed and stable  Last Vitals:  Filed Vitals:   12/07/13 1515  BP: 136/87  Pulse: 75  Temp: 36.7 C  Resp:     Complications: No apparent anesthesia complications

## 2013-12-11 NOTE — Telephone Encounter (Signed)
Patient want to know about his lab work. And stated that he does feel good. And stated that he was told that a doctor would call him back. He has a follow up with Dr Abbey Chattersosenbower on 12-21-13

## 2013-12-12 ENCOUNTER — Encounter (HOSPITAL_COMMUNITY): Payer: Self-pay | Admitting: General Surgery

## 2013-12-12 NOTE — Telephone Encounter (Signed)
Called patient this morning and told him per Dr Abbey Chattersosenbower that his lab work is normal

## 2013-12-21 ENCOUNTER — Encounter (INDEPENDENT_AMBULATORY_CARE_PROVIDER_SITE_OTHER): Payer: Self-pay | Admitting: General Surgery

## 2013-12-21 ENCOUNTER — Ambulatory Visit (INDEPENDENT_AMBULATORY_CARE_PROVIDER_SITE_OTHER): Payer: Medicare Other | Admitting: General Surgery

## 2013-12-21 VITALS — BP 124/80 | HR 80 | Temp 97.3°F | Resp 14 | Wt 251.8 lb

## 2013-12-21 DIAGNOSIS — Z4889 Encounter for other specified surgical aftercare: Secondary | ICD-10-CM

## 2013-12-21 NOTE — Patient Instructions (Signed)
For pain management, continue your current chronic pain regimen. Use Ibuprofen 600 mg every 6 hours as needed. May also use one extra strength Tylenol every 6 hours. 6 weeks after the date of surgery, may resume normal activities as tolerated, as discussed.

## 2013-12-21 NOTE — Progress Notes (Signed)
He presents for postop followup after open right inguinal pantaloon hernia repair with mesh 2 weeks ago.  He is still having some discomfort but it is improving. Swelling is decreasing. He is voiding and moving his bowels. P.E.  GU:  Incision clean/dry/intact, swelling is moderate, repair is solid.  Assessment:  Progress well 2 weeks post hernia repair.  Plan:  Continue light activities for 6 weeks postop then slowly start to resume normal activities.  Return visit as needed.

## 2015-01-18 DIAGNOSIS — K219 Gastro-esophageal reflux disease without esophagitis: Secondary | ICD-10-CM | POA: Insufficient documentation

## 2015-01-18 DIAGNOSIS — R51 Headache: Secondary | ICD-10-CM | POA: Diagnosis not present

## 2015-01-18 DIAGNOSIS — Z8709 Personal history of other diseases of the respiratory system: Secondary | ICD-10-CM | POA: Insufficient documentation

## 2015-01-18 DIAGNOSIS — M419 Scoliosis, unspecified: Secondary | ICD-10-CM | POA: Diagnosis not present

## 2015-01-18 DIAGNOSIS — Z79899 Other long term (current) drug therapy: Secondary | ICD-10-CM | POA: Diagnosis not present

## 2015-01-18 DIAGNOSIS — R05 Cough: Secondary | ICD-10-CM | POA: Insufficient documentation

## 2015-01-18 DIAGNOSIS — F419 Anxiety disorder, unspecified: Secondary | ICD-10-CM | POA: Insufficient documentation

## 2015-01-18 DIAGNOSIS — Z720 Tobacco use: Secondary | ICD-10-CM | POA: Diagnosis not present

## 2015-01-18 DIAGNOSIS — M542 Cervicalgia: Secondary | ICD-10-CM | POA: Diagnosis present

## 2015-01-19 ENCOUNTER — Emergency Department (HOSPITAL_BASED_OUTPATIENT_CLINIC_OR_DEPARTMENT_OTHER): Payer: Medicare HMO

## 2015-01-19 ENCOUNTER — Emergency Department (HOSPITAL_BASED_OUTPATIENT_CLINIC_OR_DEPARTMENT_OTHER)
Admission: EM | Admit: 2015-01-19 | Discharge: 2015-01-19 | Disposition: A | Discharge status: Home / Self Care | Payer: Medicare HMO | Attending: Emergency Medicine | Admitting: Emergency Medicine

## 2015-01-19 ENCOUNTER — Encounter (HOSPITAL_BASED_OUTPATIENT_CLINIC_OR_DEPARTMENT_OTHER): Payer: Self-pay | Admitting: Emergency Medicine

## 2015-01-19 DIAGNOSIS — R05 Cough: Secondary | ICD-10-CM

## 2015-01-19 DIAGNOSIS — R519 Headache, unspecified: Secondary | ICD-10-CM

## 2015-01-19 DIAGNOSIS — R059 Cough, unspecified: Secondary | ICD-10-CM

## 2015-01-19 DIAGNOSIS — R51 Headache: Secondary | ICD-10-CM

## 2015-01-19 MED ORDER — KETOROLAC TROMETHAMINE 60 MG/2ML IM SOLN
60.0000 mg | Freq: Once | INTRAMUSCULAR | Status: AC
  Administered 2015-01-19: 60 mg via INTRAMUSCULAR
  Filled 2015-01-19: qty 2

## 2015-01-19 MED ORDER — BENZONATATE 100 MG PO CAPS: 100.0000 mg | ORAL_CAPSULE | Freq: Three times a day (TID) | ORAL | Status: AC

## 2015-01-19 MED ORDER — METOCLOPRAMIDE HCL 5 MG/ML IJ SOLN
10.0000 mg | Freq: Once | INTRAMUSCULAR | Status: AC
  Administered 2015-01-19: 10 mg via INTRAMUSCULAR
  Filled 2015-01-19: qty 2

## 2015-01-19 MED ORDER — METOCLOPRAMIDE HCL 10 MG PO TABS: 10.0000 mg | ORAL_TABLET | Freq: Four times a day (QID) | ORAL | Status: AC | PRN

## 2015-01-19 NOTE — ED Notes (Signed)
Patient states that he has generalized pain to his head, neck and back. The patient reports that he has recently been treated for chronic back pain but feels like this is different

## 2015-01-19 NOTE — ED Provider Notes (Signed)
CSN: [id number]     Arrival date & time 01/18/15  2357 History   First MD Initiated Contact with Patient 01/19/15 0044     Chief Complaint  Patient presents with  . Neck Pain     (Consider location/radiation/quality/duration/timing/severity/associated sxs/prior Treatment) Patient is a 44 y.o. male presenting with headaches. The history is provided by the patient.  Headache Pain location:  Generalized Quality:  Dull Radiates to:  Does not radiate Onset quality:  Gradual Duration:  8 weeks Timing:  Constant Progression:  Unchanged Chronicity:  Chronic Context: not activity   Relieved by:  Nothing Worsened by:  Nothing Ineffective treatments: injections in spine for chronic pain. Associated symptoms: no abdominal pain, no blurred vision, no congestion, no diarrhea, no dizziness, no ear pain, no eye pain, no fatigue, no fever, no focal weakness, no loss of balance, no nausea, no neck stiffness, no numbness, no photophobia, no seizures, no tingling, no URI, no visual change, no vomiting and no weakness   Associated symptoms comment:  Also has new onset cough in last several days Risk factors: no anger   On oxycodone 20 which is not helping.  His doctors know about symptoms and one told him to follow up with the other  Past Medical History  Diagnosis Date  . Sleep apnea   . DDD (degenerative disc disease), lumbosacral   . Scoliosis   . AC (acromioclavicular) joint bone spurs   . Back pain   . Constipation   . Sinusitis   . GERD (gastroesophageal reflux disease)   . Anxiety   . Bronchitis     Pt reports bronchitis after using CPAP.   Past Surgical History  Procedure Laterality Date  . Tracheostomy    . Wisdom tooth extraction    . Nasal sinus surgery    . Tongue base reduction somnoplasty      also removed uvula along with soft tissue, pt has trach for 6 months folllowing surgery.  . Steroid epidural shots    . Ficet injections    . Inguinal hernia repair Right 12/07/2013     Procedure: RIGHT INGUINAL HERNIA REPAIR ;  Surgeon: Adolph Pollack, MD;  Location: [facility] OR;  Service: General;  Laterality: Right;  . Insertion of mesh Right 12/07/2013    Procedure: INSERTION OF MESH;  Surgeon: Adolph Pollack, MD;  Location: [facility] OR;  Service: General;  Laterality: Right;   Family History  Problem Relation Age of Onset  . Cancer Father     lung/mesotheliomia   History  Substance Use Topics  . Smoking status: Current Every Day Smoker -- 0.50 packs/day    Types: Cigarettes  . Smokeless tobacco: Never Used  . Alcohol Use: No    Review of Systems  Constitutional: Negative for fever, chills and fatigue.  HENT: Negative for congestion and ear pain.   Eyes: Negative for blurred vision, photophobia and pain.  Respiratory: Negative for choking, chest tightness and shortness of breath.   Cardiovascular: Negative for chest pain and leg swelling.  Gastrointestinal: Negative for nausea, vomiting, abdominal pain and diarrhea.  Musculoskeletal: Negative for neck stiffness.  Neurological: Positive for headaches. Negative for dizziness, focal weakness, seizures, weakness, numbness and loss of balance.  Hematological: Negative for adenopathy.  All other systems reviewed and are negative.     Allergies  Bee venom; Celebrex; Codeine; and Methadone  Home Medications   Prior to Admission medications   Medication Sig Start Date End Date Taking? Authorizing Provider  clonazePAM Scarlette Calico) 1  MG tablet Take 0.5-1 mg by mouth 2 (two) times daily as needed for anxiety.    Historical Provider, MD  omeprazole (PRILOSEC) 20 MG capsule Take 20 mg by mouth daily.    Historical Provider, MD  oxyCODONE (OXY IR/ROXICODONE) 5 MG immediate release tablet Take 1-2 tablets (5-10 mg total) by mouth every 4 (four) hours as needed. 12/07/13   Avel Peaceodd Rosenbower, MD  polyethylene glycol (MIRALAX / GLYCOLAX) packet Take 34 g by mouth every evening.    Historical Provider, MD  senna (SENOKOT) 8.6 MG  TABS tablet Take 4 tablets by mouth every evening.    Historical Provider, MD   BP 124/75 mmHg  Pulse 81  Temp(Src) 97.8 F (36.6 C) (Oral)  Resp 16  Ht 6\' 3"  (1.905 m)  Wt 238 lb (107.956 kg)  BMI 29.75 kg/m2  SpO2 100% Physical Exam  Constitutional: He is oriented to person, place, and time. He appears well-developed and well-nourished. No distress.  Resting comfortably in the room with all the lights on  HENT:  Head: Normocephalic and atraumatic.  Right Ear: External ear normal.  Left Ear: External ear normal.  Mouth/Throat: Oropharynx is clear and moist. No oropharyngeal exudate.  Eyes: Conjunctivae and EOM are normal. Pupils are equal, round, and reactive to light.  Neck: Normal range of motion. Neck supple.  No point tenderness.  No meningismus  Cardiovascular: Normal rate, regular rhythm and intact distal pulses.   Pulmonary/Chest: Effort normal and breath sounds normal. No respiratory distress. He has no wheezes. He has no rales.  Abdominal: Soft. Bowel sounds are normal. There is no tenderness. There is no rebound and no guarding.  Musculoskeletal: Normal range of motion. He exhibits no edema or tenderness.  Lymphadenopathy:    He has no cervical adenopathy.  Neurological: He is alert and oriented to person, place, and time. He has normal reflexes. He displays normal reflexes. No cranial nerve deficit. Coordination normal.  Intact cognition  Skin: Skin is warm and dry.  Psychiatric: He has a normal mood and affect.    ED Course  Procedures (including critical care time) Labs Review Labs Reviewed - No data to display  Imaging Review No results found.   EKG Interpretation None      MDM   Final diagnoses:  Cough   Negative chest Xray and head ct Highly doubt any acute intracranial process.  Follow up with your PMD for ongoing care.      Cy BlamerApril Tayah Idrovo, MD 01/19/15 [phone]

## 2016-01-20 ENCOUNTER — Emergency Department (HOSPITAL_BASED_OUTPATIENT_CLINIC_OR_DEPARTMENT_OTHER)
Admission: EM | Admit: 2016-01-20 | Discharge: 2016-01-20 | Disposition: A | Discharge status: Home / Self Care | Payer: Medicare HMO | Attending: Emergency Medicine | Admitting: Emergency Medicine

## 2016-01-20 ENCOUNTER — Encounter (HOSPITAL_BASED_OUTPATIENT_CLINIC_OR_DEPARTMENT_OTHER): Payer: Self-pay | Admitting: Emergency Medicine

## 2016-01-20 DIAGNOSIS — L905 Scar conditions and fibrosis of skin: Secondary | ICD-10-CM | POA: Diagnosis not present

## 2016-01-20 DIAGNOSIS — Z79891 Long term (current) use of opiate analgesic: Secondary | ICD-10-CM | POA: Diagnosis not present

## 2016-01-20 DIAGNOSIS — R51 Headache: Secondary | ICD-10-CM | POA: Diagnosis present

## 2016-01-20 DIAGNOSIS — Z87891 Personal history of nicotine dependence: Secondary | ICD-10-CM | POA: Insufficient documentation

## 2016-01-20 MED ORDER — METOCLOPRAMIDE HCL 10 MG PO TABS
10.0000 mg | ORAL_TABLET | Freq: Once | ORAL | Status: AC
  Administered 2016-01-20: 10 mg via ORAL
  Filled 2016-01-20: qty 1

## 2016-01-20 MED ORDER — METOCLOPRAMIDE HCL 10 MG PO TABS: 10.0000 mg | ORAL_TABLET | Freq: Four times a day (QID) | ORAL | Status: AC

## 2016-01-20 MED ORDER — METHOCARBAMOL 500 MG PO TABS
1000.0000 mg | ORAL_TABLET | Freq: Once | ORAL | Status: AC
  Administered 2016-01-20: 1000 mg via ORAL
  Filled 2016-01-20: qty 2

## 2016-01-20 NOTE — Discharge Instructions (Signed)
Scar Minimization °You will have a scar anytime you have surgery and a cut is made in the skin or you have something removed from your skin (mole, skin cancer, cyst). Although scars are unavoidable following surgery, there are ways to minimize their appearance. °It is important to follow all the instructions you receive from your caregiver about wound care. How your wound heals will influence the appearance of your scar. If you do not follow the wound care instructions as directed, complications such as infection may occur. Wound instructions include keeping the wound clean, moist, and not letting the wound form a scab. Some people form scars that are raised and lumpy (hypertrophic) or larger than the initial wound (keloidal). °HOME CARE INSTRUCTIONS  °· Follow wound care instructions as directed. °· Keep the wound clean by washing it with soap and water. °· Keep the wound moist with provided antibiotic cream or petroleum jelly until completely healed. Moisten twice a day for about 2 weeks. °· Get stitches (sutures) taken out at the scheduled time. °· Avoid touching or manipulating your wound unless needed. Wash your hands thoroughly before and after touching your wound. °· Follow all restrictions such as limits on exercise or work. This depends on where your scar is located. °· Keep the scar protected from sunburn. Cover the scar with sunscreen/sunblock with SPF 30 or higher. °· Gently massage the scar using a circular motion to help minimize the appearance of the scar. Do this only after the wound has closed and all the sutures have been removed. °· For hypertrophic or keloidal scars, there are several ways to treat and minimize their appearance. Methods include compression therapy, intralesional corticosteroids, laser therapy, or surgery. These methods are performed by your caregiver. °Remember that the scar may appear lighter or darker than your normal skin color. This difference in color should even out with  time. °SEEK MEDICAL CARE IF:  °· You have a fever. °· You develop signs of infection such as pain, redness, pus, and warmth. °· You have questions or concerns. °  °This information is not intended to replace advice given to you by your health care provider. Make sure you discuss any questions you have with your health care provider. °  °Document Released: 01/21/2010 Document Revised: 10/26/2011 Document Reviewed: 03/06/2015 °Elsevier Interactive Patient Education ©2016 [employer] ° °

## 2016-01-20 NOTE — ED Provider Notes (Signed)
CSN: [id number]     Arrival date & time 01/20/16  2217 History  By signing my name below, I, Joseph Rogers, attest that this documentation has been prepared under the direction and in the presence of Joseph Verge, MD. Electronically Signed: Bethel Rogers, ED Scribe. 01/20/2016. 11:26 PM  Chief Complaint  Patient presents with  . Headache   Patient is a 45 y.o. male presenting with rash. The history is provided by the patient. No language interpreter was used.  Rash Location:  Leg Leg rash location:  L lower leg Quality: itchiness   Severity:  Mild Onset quality:  Gradual Duration:  2 months Timing:  Constant Progression:  Improving Chronicity:  Chronic Context: not animal contact   Relieved by: dermatologic treatment. Worsened by:  Nothing tried Ineffective treatments:  None tried Associated symptoms: fatigue and headaches   Associated symptoms: no fever, no myalgias, no nausea and not vomiting    Joseph Rogers is a 45 y.o. male who presents to the Emergency Department complaining of a variably pruritic area at the back of his right leg with onset more than 2 months ago. The area was large, scaly, and pruritic but was "burned off" by his dermatologist and has improved. He has been seen by his PCP for the area and completed a round of amoxicillin that did not improve his symptoms. He is concerned for cellulitis after speaking with a friend who has had similar symptoms in the past.  He also complains of a headache and fatigue that he worries may be signs of infection related to the area on his leg. He is chronically constipated due to pain medication use for chronic back pain.  Pt denies fever, nausea, and vomiting. His PCP is Dr. Linna Rogers.   Past Medical History  Diagnosis Date  . Sleep apnea   . DDD (degenerative disc disease), lumbosacral   . Scoliosis   . AC (acromioclavicular) joint bone spurs   . Back pain   . Constipation   . Sinusitis   . GERD (gastroesophageal reflux  disease)   . Anxiety   . Bronchitis     Pt reports bronchitis after using CPAP.   Past Surgical History  Procedure Laterality Date  . Tracheostomy    . Wisdom tooth extraction    . Nasal sinus surgery    . Tongue base reduction somnoplasty      also removed uvula along with soft tissue, pt has trach for 6 months folllowing surgery.  . Steroid epidural shots    . Ficet injections    . Inguinal hernia repair Right 12/07/2013    Procedure: RIGHT INGUINAL HERNIA REPAIR ;  Surgeon: Adolph Pollack, MD;  Location: [facility] OR;  Service: General;  Laterality: Right;  . Insertion of mesh Right 12/07/2013    Procedure: INSERTION OF MESH;  Surgeon: Adolph Pollack, MD;  Location: [facility] OR;  Service: General;  Laterality: Right;   Family History  Problem Relation Age of Onset  . Cancer Father     lung/mesotheliomia   Social History  Substance Use Topics  . Smoking status: Former Smoker -- 0.00 packs/day  . Smokeless tobacco: Never Used  . Alcohol Use: No    Review of Systems  Constitutional: Positive for fatigue. Negative for fever.  Eyes: Negative for photophobia.  Cardiovascular: Negative for chest pain.  Gastrointestinal: Negative for nausea and vomiting.  Musculoskeletal: Negative for myalgias, neck pain and neck stiffness.  Skin: Positive for rash.  An itchy area at the posterior left leg   Neurological: Positive for headaches. Negative for dizziness, facial asymmetry, weakness, light-headedness and numbness.  All other systems reviewed and are negative.  Allergies  Bee venom; Celebrex; Codeine; and Methadone  Home Medications   Prior to Admission medications   Medication Sig Start Date End Date Taking? Authorizing Provider  oxyCODONE (ROXICODONE) 15 MG immediate release tablet Take 20 mg by mouth 4 (four) times daily.   Yes Historical Provider, MD  benzonatate (TESSALON) 100 MG capsule Take 1 capsule (100 mg total) by mouth every 8 (eight) hours. 01/19/15   Chanel Mcadams, MD   clonazePAM (KLONOPIN) 1 MG tablet Take 0.5-1 mg by mouth 2 (two) times daily as needed for anxiety.    Historical Provider, MD  metoCLOPramide (REGLAN) 10 MG tablet Take 1 tablet (10 mg total) by mouth every 6 (six) hours as needed for nausea. 01/19/15   Jorden Mahl, MD  omeprazole (PRILOSEC) 20 MG capsule Take 20 mg by mouth daily.    Historical Provider, MD  oxyCODONE (OXY IR/ROXICODONE) 5 MG immediate release tablet Take 1-2 tablets (5-10 mg total) by mouth every 4 (four) hours as needed. 12/07/13   Avel Peaceodd Rosenbower, MD  polyethylene glycol (MIRALAX / GLYCOLAX) packet Take 34 g by mouth every evening.    Historical Provider, MD  senna (SENOKOT) 8.6 MG TABS tablet Take 4 tablets by mouth every evening.    Historical Provider, MD   BP 149/90 mmHg  Pulse 75  Temp(Src) 98.6 F (37 C) (Oral)  Resp 18  Ht 6\' 5"  (1.956 m)  Wt 240 lb (108.863 kg)  BMI 28.45 kg/m2  SpO2 98% Physical Exam  Constitutional: He is oriented to person, place, and time. He appears well-developed and well-nourished.  Well appearing sitting calmly in a chair will all the lights on  HENT:  Head: Normocephalic and atraumatic.  Mouth/Throat: Oropharynx is clear and moist.  Moist mucous membranes No exudate  Eyes: Pupils are equal, round, and reactive to light.  Neck: Normal range of motion. Neck supple. No rigidity. Normal range of motion present. No Brudzinski's sign and no Kernig's sign noted.  Trachea midline  Cardiovascular: Normal rate, regular rhythm and intact distal pulses.   Pulmonary/Chest: Effort normal and breath sounds normal. No stridor. No respiratory distress. He has no wheezes. He has no rales.  CTAB  Abdominal: Soft. Bowel sounds are normal. He exhibits no mass. There is no tenderness. There is no rebound and no guarding.  Musculoskeletal: Normal range of motion.  Muscle spasm at the left trapezius   Lymphadenopathy:    He has no cervical adenopathy.  Neurological: He is alert and oriented to  person, place, and time. He has normal reflexes. He displays normal reflexes. No cranial nerve deficit. He exhibits normal muscle tone.  Skin: Skin is warm and dry.  Psychiatric: He has a normal mood and affect. His behavior is normal.  Nursing note and vitals reviewed.   ED Course  Procedures (including critical care time) DIAGNOSTIC STUDIES: Oxygen Saturation is 98% on RA,  normal by my interpretation.    COORDINATION OF CARE: 11:12 PM Discussed treatment plan which includes Reglan and Robaxin with pt at bedside and pt agreed to plan.   MDM   Final diagnoses:  None   Filed Vitals:   01/20/16 2222  BP: 149/90  Pulse: 75  Temp: 98.6 F (37 C)  Resp: 18   Medications  metoCLOPramide (REGLAN) tablet 10 mg (10 mg Oral Given  01/20/16 2329)  methocarbamol (ROBAXIN) tablet 1,000 mg (1,000 mg Oral Given 01/20/16 2329)    Headache and vague symptoms for weeks.  Sot on his leg is definitively a scar from having a growth burned off.  There is NO cellulitis.  Neck is supple patient is well appearing with normal exam and vital signs.  He has a follow up with his PMD.  He has had no instrumentation no fevers.  Strict return precautions given  I personally performed the services described in this documentation, which was scribed in my presence. The recorded information has been reviewed and is accurate.     Cy Blamer, MD 01/20/16 2349

## 2016-01-20 NOTE — ED Notes (Signed)
Patient reports that he has a spot on his leg that he had the dermatologist burn, but he talked to a friend today and thought that because she had cellulitis that he might as well. The patient reports that he has had fatigue and lack of engery, with HA and neck stiffness for 2 months, and he feels like this is all related/

## 2016-09-24 IMAGING — CT CT HEAD W/O CM
1 series · 16 of 30 positions shown, 20 images · non-contrast
Comparison: None.

CLINICAL DATA: Generalized pain to the head, neck and back.
Headache for 2 months.

EXAM:
CT HEAD WITHOUT CONTRAST
TECHNIQUE: Contiguous axial images were obtained from the base of the skull
through the vertex without intravenous contrast.

[Series 2: head 4.8 h37s · axial · 0.48mm/px · z∈[-182,-30]mm · 16 of 36 slices shown, 20 images]
[im 2/36  brain]
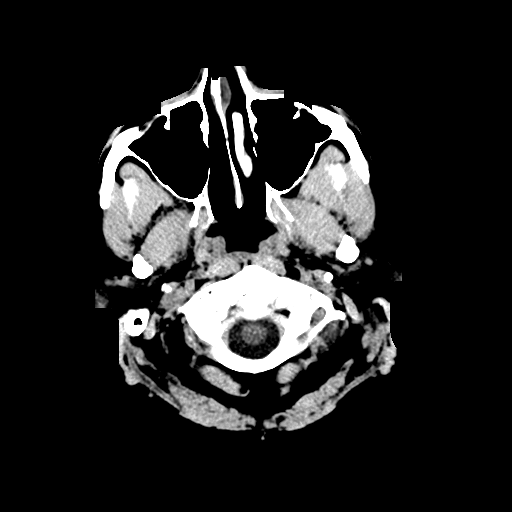
[im 2/36  bone]
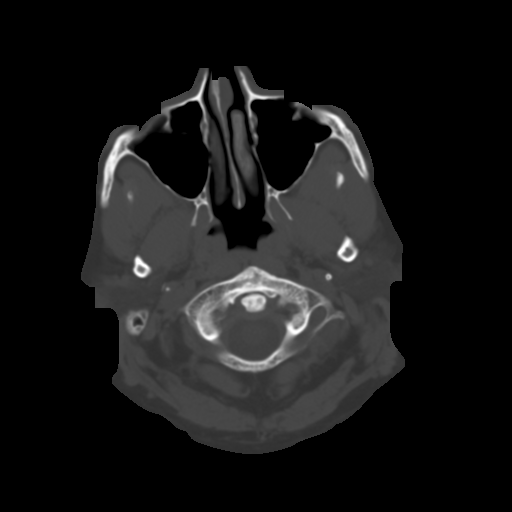
[im 4/36  brain]
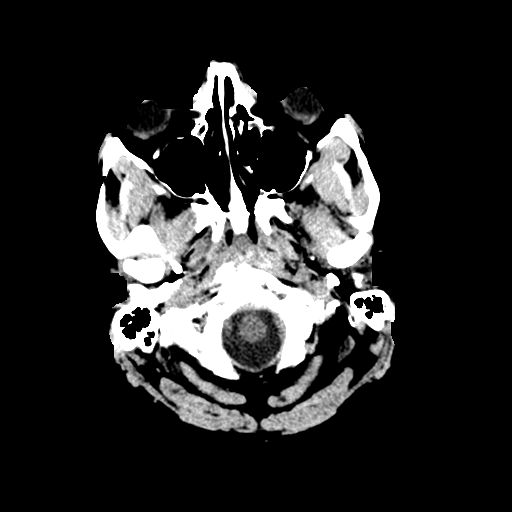
[im 7/36  brain]
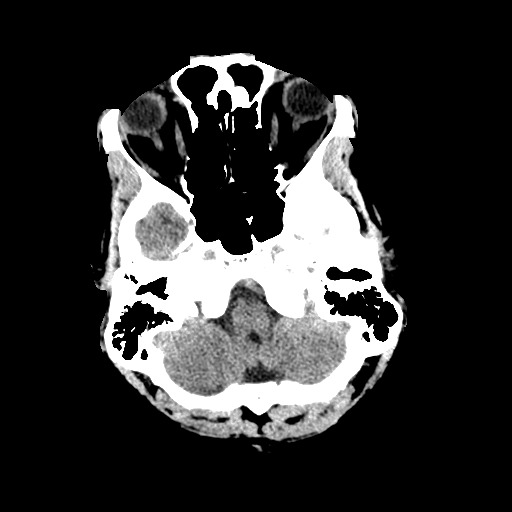
[im 9/36  brain]
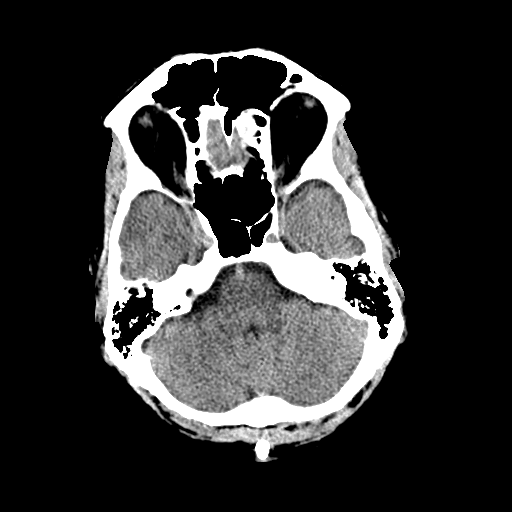
[im 10/36  brain]
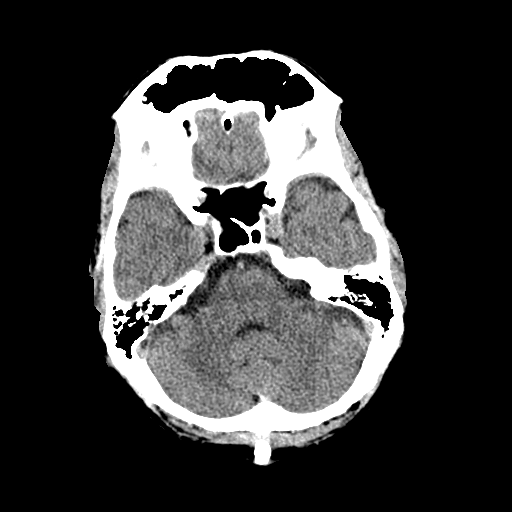
[im 10/36  bone]
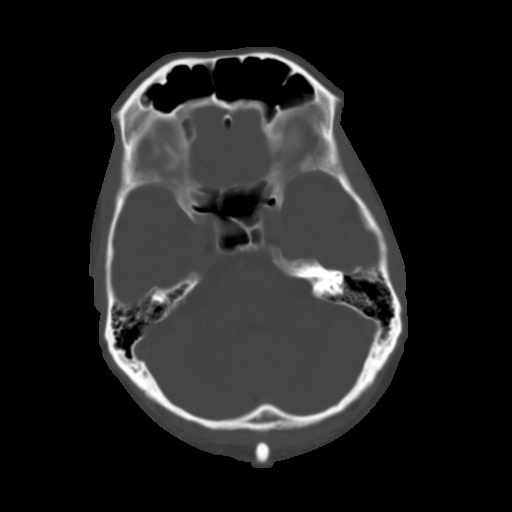
[im 13/36  brain]
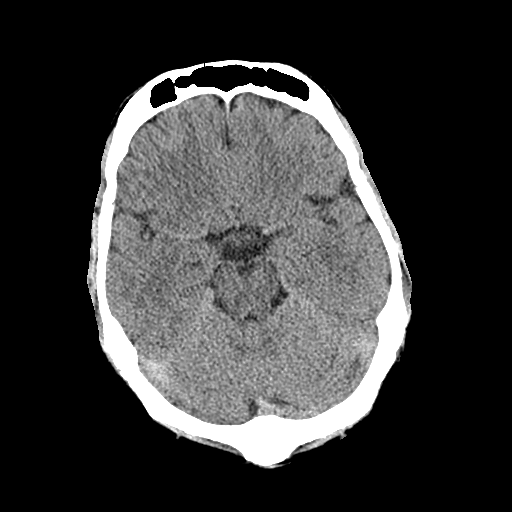
[im 15/36  brain]
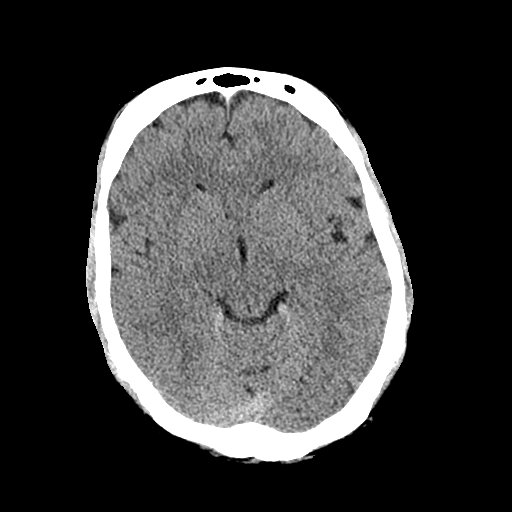
[im 17/36  brain]
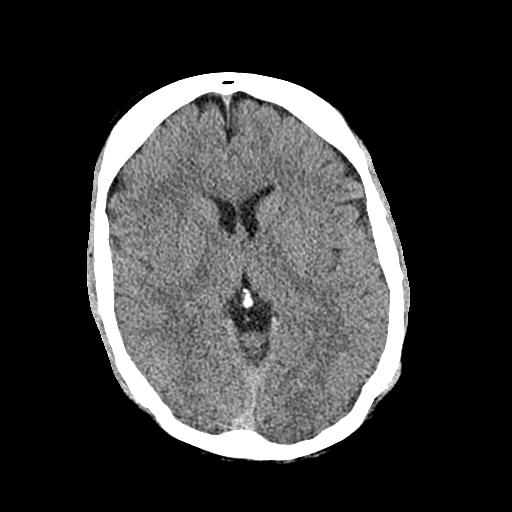
[im 19/36  brain]
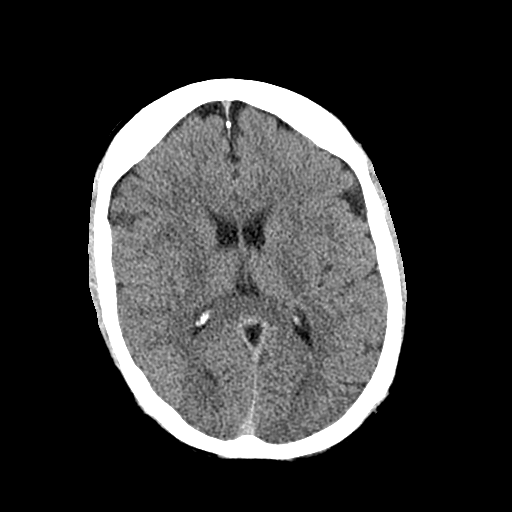
[im 19/36  bone]
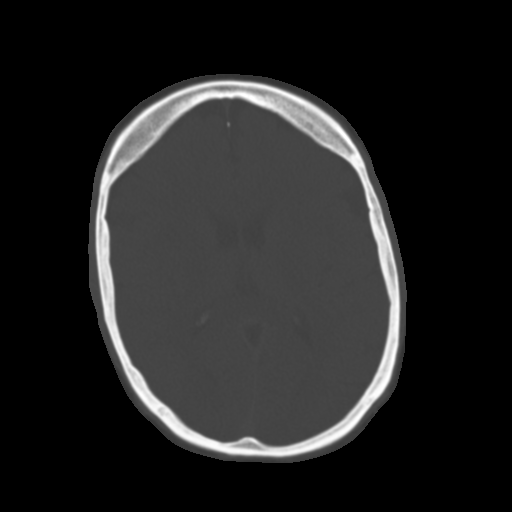
[im 21/36  brain]
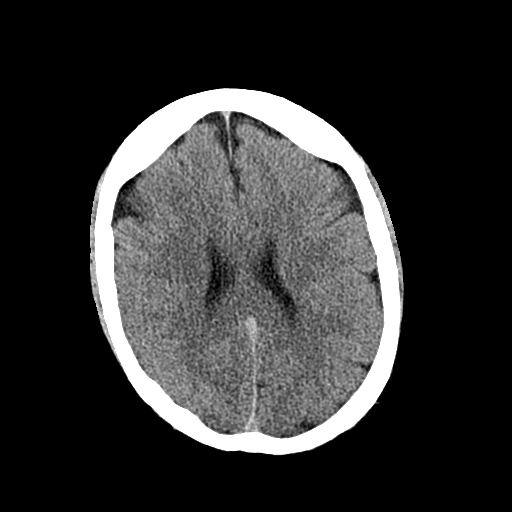
[im 23/36  brain]
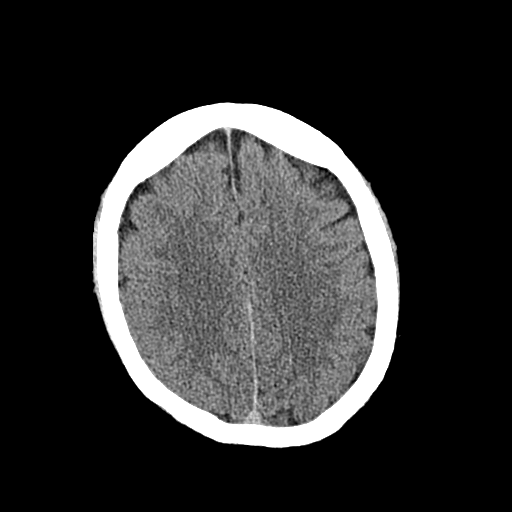
[im 26/36  brain]
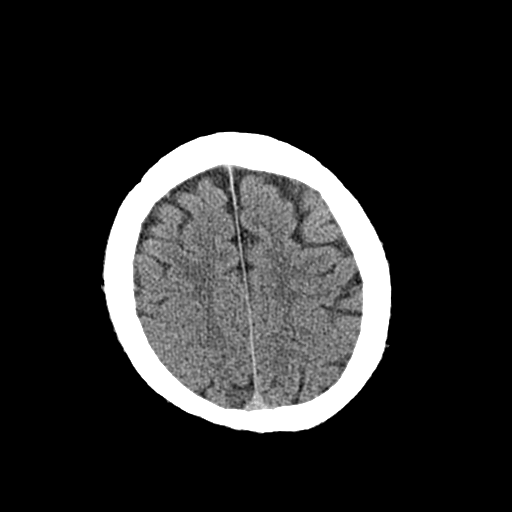
[im 27/36  brain]
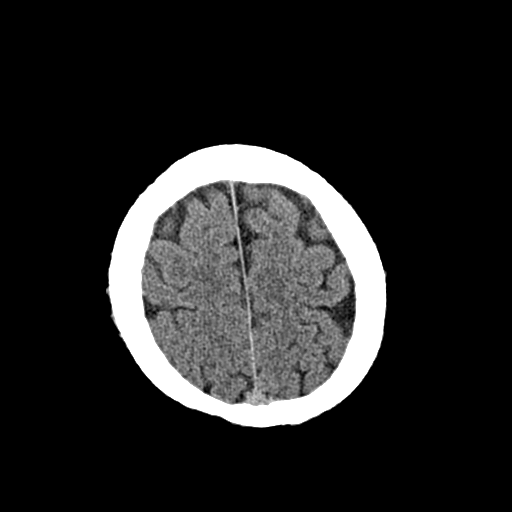
[im 27/36  bone]
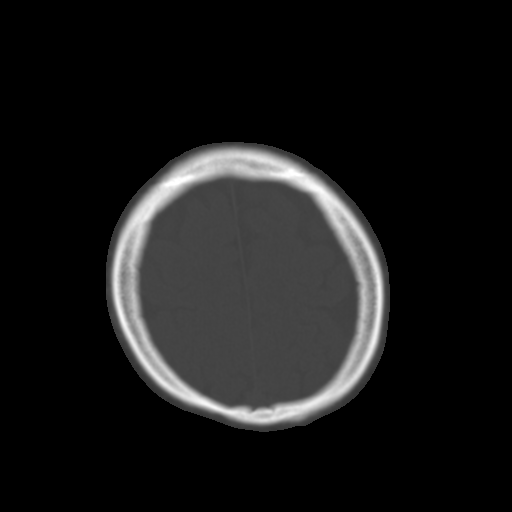
[im 29/36  brain]
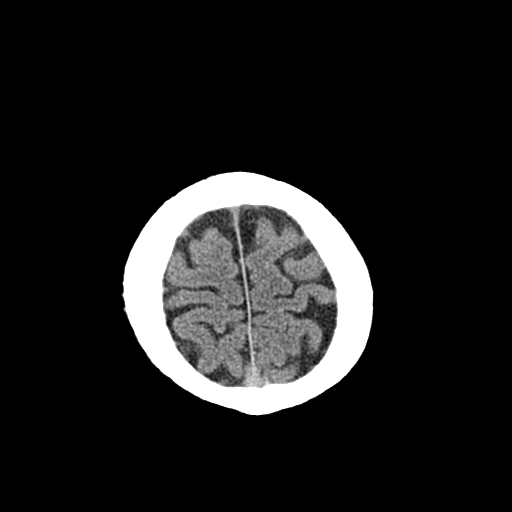
[im 32/36  brain]
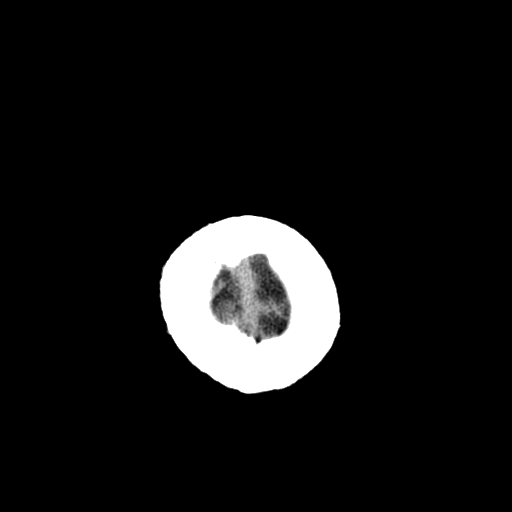
[im 34/36  brain]
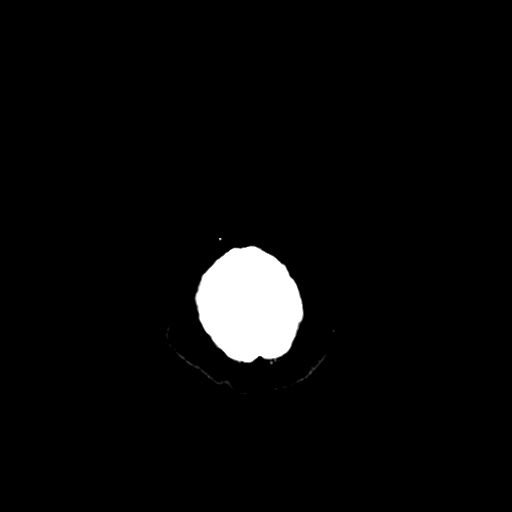

[16 of 30 positions shown; findings below may reference images not displayed]

FINDINGS: No intracranial hemorrhage, mass effect, or midline shift. No
hydrocephalus. The basilar cisterns are patent. No evidence of
territorial infarct. No intracranial fluid collection. Calvarium is
intact. Minimal mucosal thickening in the left sphenoid sinus, the
paranasal sinuses and mastoid air cells are otherwise well aerated.
IMPRESSION: No acute intracranial abnormality.

## 2018-02-21 ENCOUNTER — Other Ambulatory Visit: Payer: Self-pay

## 2018-02-21 ENCOUNTER — Encounter (HOSPITAL_BASED_OUTPATIENT_CLINIC_OR_DEPARTMENT_OTHER): Payer: Self-pay

## 2018-02-21 ENCOUNTER — Emergency Department (HOSPITAL_BASED_OUTPATIENT_CLINIC_OR_DEPARTMENT_OTHER)
Admission: EM | Admit: 2018-02-21 | Discharge: 2018-02-21 | Disposition: A | Discharge status: Home / Self Care | Payer: Medicare HMO | Attending: Emergency Medicine | Admitting: Emergency Medicine

## 2018-02-21 DIAGNOSIS — S61512A Laceration without foreign body of left wrist, initial encounter: Secondary | ICD-10-CM | POA: Diagnosis not present

## 2018-02-21 DIAGNOSIS — Y9389 Activity, other specified: Secondary | ICD-10-CM | POA: Insufficient documentation

## 2018-02-21 DIAGNOSIS — Y929 Unspecified place or not applicable: Secondary | ICD-10-CM | POA: Diagnosis not present

## 2018-02-21 DIAGNOSIS — Y999 Unspecified external cause status: Secondary | ICD-10-CM | POA: Insufficient documentation

## 2018-02-21 DIAGNOSIS — Z79899 Other long term (current) drug therapy: Secondary | ICD-10-CM | POA: Insufficient documentation

## 2018-02-21 DIAGNOSIS — W268XXA Contact with other sharp object(s), not elsewhere classified, initial encounter: Secondary | ICD-10-CM | POA: Insufficient documentation

## 2018-02-21 DIAGNOSIS — Z87891 Personal history of nicotine dependence: Secondary | ICD-10-CM | POA: Diagnosis not present

## 2018-02-21 MED ORDER — LIDOCAINE HCL (PF) 1 % IJ SOLN
10.0000 mL | Freq: Once | INTRAMUSCULAR | Status: AC
  Administered 2018-02-21: 10 mL via INTRADERMAL
  Filled 2018-02-21: qty 10

## 2018-02-21 MED ORDER — TETANUS-DIPHTH-ACELL PERTUSSIS 5-2.5-18.5 LF-MCG/0.5 IM SUSP
0.5000 mL | Freq: Once | INTRAMUSCULAR | Status: DC
  Filled 2018-02-21: qty 0.5

## 2018-02-21 NOTE — ED Provider Notes (Signed)
[facility] EMERGENCY DEPARTMENT Provider Note   CSN: [id number] Arrival date & time: 02/21/18  [id number]     History   Chief Complaint Chief Complaint  Patient presents with  . Laceration    HPI Joseph Rogers is a 47 y.o. male.  The history is provided by the patient. No language interpreter was used.  Laceration   The incident occurred less than 1 hour ago. The laceration is located on the left arm. The laceration is 1 cm in size. The laceration mechanism was a a razor. The pain is at a severity of 2/10. The pain is mild. The pain has been intermittent since onset. He reports no foreign bodies present. His tetanus status is unknown.  Is a 47 year old male who presents for evaluation of laceration of the left wrist.  Patient states that he was working placing a block in a door and had to cut the wood.  He had his wrist up top and he slipped and caused a small cut in the left wrist.  He states that it was bleeding a lot earlier and his coworker was very concerned he might of hit his artery.  He states the bleeding is completely resolved.  He denies any numbness, tingling, weakness.  He is right-hand dominant.  Past Medical History:  Diagnosis Date  . AC (acromioclavicular) joint bone spurs   . Anxiety   . Back pain   . Bronchitis    Pt reports bronchitis after using CPAP.  Marland Kitchen. Constipation   . DDD (degenerative disc disease), lumbosacral   . GERD (gastroesophageal reflux disease)   . Scoliosis   . Sinusitis   . Sleep apnea     Patient Active Problem List   Diagnosis Date Noted  . Inguinal hernia without mention of obstruction or gangrene, unilateral or unspecified, (not specified as recurrent)-right 10/20/2013  . Chronic constipation 10/20/2013  . Chronic pain syndrome 10/20/2013  . OSA (obstructive sleep apnea) 10/20/2013  . Chronic low back pain 10/20/2013    Past Surgical History:  Procedure Laterality Date  . Ficet injections    . INGUINAL HERNIA REPAIR Right  12/07/2013   Procedure: RIGHT INGUINAL HERNIA REPAIR ;  Surgeon: Adolph Pollackodd J Rosenbower, MD;  [facility];  Service: General;  Laterality: Right;  . INSERTION OF MESH Right 12/07/2013   Procedure: INSERTION OF MESH;  Surgeon: Adolph Pollackodd J Rosenbower, MD;  [facility];  Service: General;  Laterality: Right;  . NASAL SINUS SURGERY    . Steroid epidural shots    . TONGUE BASE REDUCTION SOMNOPLASTY     also removed uvula along with soft tissue, pt has trach for 6 months folllowing surgery.  . TRACHEOSTOMY    . WISDOM TOOTH EXTRACTION          Home Medications    Prior to Admission medications   Medication Sig Start Date End Date Taking? Authorizing Provider  benzonatate (TESSALON) 100 MG capsule Take 1 capsule (100 mg total) by mouth every 8 (eight) hours. 01/19/15  Yes Palumbo, April, MD  Buprenorphine HCl-Naloxone HCl (ZUBSOLV) 5.7-1.4 MG SUBL Place under the tongue.   Yes [provider]  clonazePAM (KLONOPIN) 1 MG tablet Take 0.5-1 mg by mouth 2 (two) times daily as needed for anxiety.   Yes [provider]  metoCLOPramide (REGLAN) 10 MG tablet Take 1 tablet (10 mg total) by mouth every 6 (six) hours as needed for nausea. 01/19/15  Yes Palumbo, April, MD  metoCLOPramide (REGLAN) 10 MG tablet Take  1 tablet (10 mg total) by mouth every 6 (six) hours. 01/20/16  Yes Palumbo, April, MD  omeprazole (PRILOSEC) 20 MG capsule Take 20 mg by mouth daily.   Yes [provider]  oxyCODONE (OXY IR/ROXICODONE) 5 MG immediate release tablet Take 1-2 tablets (5-10 mg total) by mouth every 4 (four) hours as needed. 12/07/13  Yes Avel Peace, MD  oxyCODONE (ROXICODONE) 15 MG immediate release tablet Take 20 mg by mouth 4 (four) times daily.   Yes [provider]  polyethylene glycol (MIRALAX / GLYCOLAX) packet Take 34 g by mouth every evening.   Yes [provider]  senna (SENOKOT) 8.6 MG TABS tablet Take 4 tablets by mouth every evening.   Yes [provider]      Family History Family History  Problem Relation Age of Onset  . Cancer Father        lung/mesotheliomia    Social History Social History   Tobacco Use  . Smoking status: Former Smoker    Packs/day: 0.00  . Smokeless tobacco: Never Used  Substance Use Topics  . Alcohol use: No  . Drug use: No     Allergies   Bee venom; Celebrex [celecoxib]; Codeine; and Methadone   Review of Systems Review of Systems  Skin: Positive for wound.  Neurological: Negative for weakness and numbness.     Physical Exam Updated Vital Signs BP (!) 119/92 (BP Location: Right Arm)   Pulse 84   Temp 98.3 F (36.8 C) (Oral)   Resp 18   Ht 6\' 3"  (1.905 m)   Wt 122.5 kg (270 lb)   SpO2 98%   BMI 33.75 kg/m   Physical Exam  Constitutional: He appears well-developed and well-nourished. No distress.  HENT:  Head: Normocephalic and atraumatic.  Eyes: Conjunctivae are normal. No scleral icterus.  Neck: Normal range of motion. Neck supple.  Cardiovascular: Normal rate, regular rhythm and normal heart sounds.  Pulmonary/Chest: Effort normal and breath sounds normal. No respiratory distress.  Abdominal: Soft. There is no tenderness.  Musculoskeletal: He exhibits no edema.  1cm facial laceration over the left wrist below the base of the thumb.  Normal radial pulse, normal sensation, full range of motion of the thumb and other digits.  Normal grip strength, normal range of motion of the wrist.  Neurological: He is alert.  Skin: Skin is warm and dry. He is not diaphoretic.  Psychiatric: His behavior is normal.  Nursing note and vitals reviewed.    ED Treatments / Results  Labs (all labs ordered are listed, but only abnormal results are displayed) Labs Reviewed - No data to display  EKG None  Radiology No results found.  Procedures Procedures (including critical care time)  Medications Ordered in ED Medications  Tdap (BOOSTRIX) injection 0.5 mL (has no administration in time  range)  lidocaine (PF) (XYLOCAINE) 1 % injection 10 mL (10 mLs Intradermal Given 02/21/18 1936)     Initial Impression / Assessment and Plan / ED Course  I have reviewed the triage vital signs and the nursing notes.  Pertinent labs & imaging results that were available during my care of the patient were reviewed by me and considered in my medical decision making (see chart for details).     Joseph Rogers is a 47 y.o. male who presents to ED for laceration of wrist. Wound thoroughly cleaned in ED today. Wound explored and bottom of wound seen in a bloodless field. . Patient counseled on home wound care. Marland Kitchen  Patient was urged to return to the Emergency Department for worsening pain, swelling, expanding erythema especially if it streaks away from the affected area, fever, or for any additional concerns. Patient verbalized understanding. All questions answered.  Final Clinical Impressions(s) / ED Diagnoses   Final diagnoses:  Laceration of left wrist, initial encounter    ED Discharge Orders    None       Arthor Captain, PA-C 02/21/18 2042    Little, Ambrose Finland, MD 02/23/18 1246

## 2018-02-21 NOTE — Discharge Instructions (Addendum)
Get help right away if: °You have a red streak going away from your wound. °The edges of the wound open up and separate. °Your wound is bleeding and the bleeding does not stop with gentle pressure. °You have a rash. °You faint. °You have trouble breathing. °

## 2018-02-21 NOTE — ED Triage Notes (Signed)
Lac to left hand/wrist- cut with razor knife. Denies blood thinners. Bleeding controlled.

## 2018-02-21 NOTE — ED Notes (Signed)
Superficial laceration to left palm. Irrigated and cleaned. Bleeding controlled. Tetanus injection <5 yrs.

## 2019-01-04 DIAGNOSIS — F1120 Opioid dependence, uncomplicated: Secondary | ICD-10-CM | POA: Diagnosis not present

## 2019-01-04 DIAGNOSIS — M9905 Segmental and somatic dysfunction of pelvic region: Secondary | ICD-10-CM | POA: Diagnosis not present

## 2019-01-04 DIAGNOSIS — F1123 Opioid dependence with withdrawal: Secondary | ICD-10-CM | POA: Diagnosis not present

## 2019-01-04 DIAGNOSIS — Z79891 Long term (current) use of opiate analgesic: Secondary | ICD-10-CM | POA: Diagnosis not present

## 2019-01-04 DIAGNOSIS — F1994 Other psychoactive substance use, unspecified with psychoactive substance-induced mood disorder: Secondary | ICD-10-CM | POA: Diagnosis not present

## 2019-01-04 DIAGNOSIS — M5136 Other intervertebral disc degeneration, lumbar region: Secondary | ICD-10-CM | POA: Diagnosis not present

## 2019-01-04 DIAGNOSIS — S336XXA Sprain of sacroiliac joint, initial encounter: Secondary | ICD-10-CM | POA: Diagnosis not present

## 2019-01-04 DIAGNOSIS — M9901 Segmental and somatic dysfunction of cervical region: Secondary | ICD-10-CM | POA: Diagnosis not present

## 2019-01-04 DIAGNOSIS — M5441 Lumbago with sciatica, right side: Secondary | ICD-10-CM | POA: Diagnosis not present

## 2019-01-04 DIAGNOSIS — M9903 Segmental and somatic dysfunction of lumbar region: Secondary | ICD-10-CM | POA: Diagnosis not present

## 2019-01-04 DIAGNOSIS — M9902 Segmental and somatic dysfunction of thoracic region: Secondary | ICD-10-CM | POA: Diagnosis not present

## 2019-01-04 DIAGNOSIS — Z6833 Body mass index (BMI) 33.0-33.9, adult: Secondary | ICD-10-CM | POA: Diagnosis not present

## 2019-01-19 DIAGNOSIS — M9905 Segmental and somatic dysfunction of pelvic region: Secondary | ICD-10-CM | POA: Diagnosis not present

## 2019-01-19 DIAGNOSIS — M5441 Lumbago with sciatica, right side: Secondary | ICD-10-CM | POA: Diagnosis not present

## 2019-01-19 DIAGNOSIS — M9902 Segmental and somatic dysfunction of thoracic region: Secondary | ICD-10-CM | POA: Diagnosis not present

## 2019-01-19 DIAGNOSIS — M5136 Other intervertebral disc degeneration, lumbar region: Secondary | ICD-10-CM | POA: Diagnosis not present

## 2019-01-19 DIAGNOSIS — S336XXA Sprain of sacroiliac joint, initial encounter: Secondary | ICD-10-CM | POA: Diagnosis not present

## 2019-01-19 DIAGNOSIS — M9903 Segmental and somatic dysfunction of lumbar region: Secondary | ICD-10-CM | POA: Diagnosis not present

## 2019-01-19 DIAGNOSIS — M9901 Segmental and somatic dysfunction of cervical region: Secondary | ICD-10-CM | POA: Diagnosis not present

## 2019-01-26 DIAGNOSIS — S336XXA Sprain of sacroiliac joint, initial encounter: Secondary | ICD-10-CM | POA: Diagnosis not present

## 2019-01-26 DIAGNOSIS — M9903 Segmental and somatic dysfunction of lumbar region: Secondary | ICD-10-CM | POA: Diagnosis not present

## 2019-01-26 DIAGNOSIS — M9901 Segmental and somatic dysfunction of cervical region: Secondary | ICD-10-CM | POA: Diagnosis not present

## 2019-01-26 DIAGNOSIS — M5136 Other intervertebral disc degeneration, lumbar region: Secondary | ICD-10-CM | POA: Diagnosis not present

## 2019-01-26 DIAGNOSIS — M5441 Lumbago with sciatica, right side: Secondary | ICD-10-CM | POA: Diagnosis not present

## 2019-01-26 DIAGNOSIS — M9902 Segmental and somatic dysfunction of thoracic region: Secondary | ICD-10-CM | POA: Diagnosis not present

## 2019-01-26 DIAGNOSIS — M9905 Segmental and somatic dysfunction of pelvic region: Secondary | ICD-10-CM | POA: Diagnosis not present

## 2019-02-08 DIAGNOSIS — M9901 Segmental and somatic dysfunction of cervical region: Secondary | ICD-10-CM | POA: Diagnosis not present

## 2019-02-08 DIAGNOSIS — M9903 Segmental and somatic dysfunction of lumbar region: Secondary | ICD-10-CM | POA: Diagnosis not present

## 2019-02-08 DIAGNOSIS — M546 Pain in thoracic spine: Secondary | ICD-10-CM | POA: Diagnosis not present

## 2019-02-08 DIAGNOSIS — M5386 Other specified dorsopathies, lumbar region: Secondary | ICD-10-CM | POA: Diagnosis not present

## 2019-02-08 DIAGNOSIS — G44209 Tension-type headache, unspecified, not intractable: Secondary | ICD-10-CM | POA: Diagnosis not present

## 2019-02-08 DIAGNOSIS — M542 Cervicalgia: Secondary | ICD-10-CM | POA: Diagnosis not present

## 2019-02-08 DIAGNOSIS — M9902 Segmental and somatic dysfunction of thoracic region: Secondary | ICD-10-CM | POA: Diagnosis not present

## 2019-02-08 DIAGNOSIS — M545 Low back pain: Secondary | ICD-10-CM | POA: Diagnosis not present

## 2019-02-13 DIAGNOSIS — G44209 Tension-type headache, unspecified, not intractable: Secondary | ICD-10-CM | POA: Diagnosis not present

## 2019-02-13 DIAGNOSIS — M9902 Segmental and somatic dysfunction of thoracic region: Secondary | ICD-10-CM | POA: Diagnosis not present

## 2019-02-13 DIAGNOSIS — M546 Pain in thoracic spine: Secondary | ICD-10-CM | POA: Diagnosis not present

## 2019-02-13 DIAGNOSIS — M542 Cervicalgia: Secondary | ICD-10-CM | POA: Diagnosis not present

## 2019-02-13 DIAGNOSIS — M5386 Other specified dorsopathies, lumbar region: Secondary | ICD-10-CM | POA: Diagnosis not present

## 2019-02-13 DIAGNOSIS — M9903 Segmental and somatic dysfunction of lumbar region: Secondary | ICD-10-CM | POA: Diagnosis not present

## 2019-02-13 DIAGNOSIS — M545 Low back pain: Secondary | ICD-10-CM | POA: Diagnosis not present

## 2019-02-13 DIAGNOSIS — M9901 Segmental and somatic dysfunction of cervical region: Secondary | ICD-10-CM | POA: Diagnosis not present

## 2019-02-15 DIAGNOSIS — Z79891 Long term (current) use of opiate analgesic: Secondary | ICD-10-CM | POA: Diagnosis not present

## 2019-02-15 DIAGNOSIS — Z6833 Body mass index (BMI) 33.0-33.9, adult: Secondary | ICD-10-CM | POA: Diagnosis not present

## 2019-02-15 DIAGNOSIS — F1994 Other psychoactive substance use, unspecified with psychoactive substance-induced mood disorder: Secondary | ICD-10-CM | POA: Diagnosis not present

## 2019-02-15 DIAGNOSIS — F1123 Opioid dependence with withdrawal: Secondary | ICD-10-CM | POA: Diagnosis not present

## 2019-02-15 DIAGNOSIS — F1120 Opioid dependence, uncomplicated: Secondary | ICD-10-CM | POA: Diagnosis not present

## 2019-02-23 DIAGNOSIS — G44209 Tension-type headache, unspecified, not intractable: Secondary | ICD-10-CM | POA: Diagnosis not present

## 2019-02-23 DIAGNOSIS — M5386 Other specified dorsopathies, lumbar region: Secondary | ICD-10-CM | POA: Diagnosis not present

## 2019-02-23 DIAGNOSIS — M545 Low back pain: Secondary | ICD-10-CM | POA: Diagnosis not present

## 2019-02-23 DIAGNOSIS — E669 Obesity, unspecified: Secondary | ICD-10-CM | POA: Diagnosis not present

## 2019-02-23 DIAGNOSIS — M9901 Segmental and somatic dysfunction of cervical region: Secondary | ICD-10-CM | POA: Diagnosis not present

## 2019-02-23 DIAGNOSIS — Z6833 Body mass index (BMI) 33.0-33.9, adult: Secondary | ICD-10-CM | POA: Diagnosis not present

## 2019-02-23 DIAGNOSIS — K210 Gastro-esophageal reflux disease with esophagitis: Secondary | ICD-10-CM | POA: Diagnosis not present

## 2019-02-23 DIAGNOSIS — M9903 Segmental and somatic dysfunction of lumbar region: Secondary | ICD-10-CM | POA: Diagnosis not present

## 2019-02-23 DIAGNOSIS — M546 Pain in thoracic spine: Secondary | ICD-10-CM | POA: Diagnosis not present

## 2019-02-23 DIAGNOSIS — M542 Cervicalgia: Secondary | ICD-10-CM | POA: Diagnosis not present

## 2019-02-23 DIAGNOSIS — M9902 Segmental and somatic dysfunction of thoracic region: Secondary | ICD-10-CM | POA: Diagnosis not present

## 2019-03-08 DIAGNOSIS — M546 Pain in thoracic spine: Secondary | ICD-10-CM | POA: Diagnosis not present

## 2019-03-08 DIAGNOSIS — M542 Cervicalgia: Secondary | ICD-10-CM | POA: Diagnosis not present

## 2019-03-08 DIAGNOSIS — M9901 Segmental and somatic dysfunction of cervical region: Secondary | ICD-10-CM | POA: Diagnosis not present

## 2019-03-08 DIAGNOSIS — S335XXA Sprain of ligaments of lumbar spine, initial encounter: Secondary | ICD-10-CM | POA: Diagnosis not present

## 2019-03-08 DIAGNOSIS — M9903 Segmental and somatic dysfunction of lumbar region: Secondary | ICD-10-CM | POA: Diagnosis not present

## 2019-03-08 DIAGNOSIS — M9902 Segmental and somatic dysfunction of thoracic region: Secondary | ICD-10-CM | POA: Diagnosis not present

## 2019-03-08 DIAGNOSIS — M545 Low back pain: Secondary | ICD-10-CM | POA: Diagnosis not present

## 2019-03-08 DIAGNOSIS — G44209 Tension-type headache, unspecified, not intractable: Secondary | ICD-10-CM | POA: Diagnosis not present

## 2019-03-08 DIAGNOSIS — M5386 Other specified dorsopathies, lumbar region: Secondary | ICD-10-CM | POA: Diagnosis not present

## 2019-03-09 DIAGNOSIS — S335XXA Sprain of ligaments of lumbar spine, initial encounter: Secondary | ICD-10-CM | POA: Diagnosis not present

## 2019-03-09 DIAGNOSIS — M546 Pain in thoracic spine: Secondary | ICD-10-CM | POA: Diagnosis not present

## 2019-03-09 DIAGNOSIS — M5386 Other specified dorsopathies, lumbar region: Secondary | ICD-10-CM | POA: Diagnosis not present

## 2019-03-09 DIAGNOSIS — M9901 Segmental and somatic dysfunction of cervical region: Secondary | ICD-10-CM | POA: Diagnosis not present

## 2019-03-09 DIAGNOSIS — M9902 Segmental and somatic dysfunction of thoracic region: Secondary | ICD-10-CM | POA: Diagnosis not present

## 2019-03-09 DIAGNOSIS — M9903 Segmental and somatic dysfunction of lumbar region: Secondary | ICD-10-CM | POA: Diagnosis not present

## 2019-03-09 DIAGNOSIS — G44209 Tension-type headache, unspecified, not intractable: Secondary | ICD-10-CM | POA: Diagnosis not present

## 2019-03-09 DIAGNOSIS — M542 Cervicalgia: Secondary | ICD-10-CM | POA: Diagnosis not present

## 2019-03-09 DIAGNOSIS — M545 Low back pain: Secondary | ICD-10-CM | POA: Diagnosis not present

## 2019-03-15 DIAGNOSIS — F1994 Other psychoactive substance use, unspecified with psychoactive substance-induced mood disorder: Secondary | ICD-10-CM | POA: Diagnosis not present

## 2019-03-15 DIAGNOSIS — F11988 Opioid use, unspecified with other opioid-induced disorder: Secondary | ICD-10-CM | POA: Diagnosis not present

## 2019-03-15 DIAGNOSIS — Z6833 Body mass index (BMI) 33.0-33.9, adult: Secondary | ICD-10-CM | POA: Diagnosis not present

## 2019-03-15 DIAGNOSIS — F1120 Opioid dependence, uncomplicated: Secondary | ICD-10-CM | POA: Diagnosis not present

## 2019-03-15 DIAGNOSIS — Z79891 Long term (current) use of opiate analgesic: Secondary | ICD-10-CM | POA: Diagnosis not present

## 2019-03-15 DIAGNOSIS — F1123 Opioid dependence with withdrawal: Secondary | ICD-10-CM | POA: Diagnosis not present

## 2019-03-20 DIAGNOSIS — S335XXA Sprain of ligaments of lumbar spine, initial encounter: Secondary | ICD-10-CM | POA: Diagnosis not present

## 2019-03-20 DIAGNOSIS — G44209 Tension-type headache, unspecified, not intractable: Secondary | ICD-10-CM | POA: Diagnosis not present

## 2019-03-20 DIAGNOSIS — M9902 Segmental and somatic dysfunction of thoracic region: Secondary | ICD-10-CM | POA: Diagnosis not present

## 2019-03-20 DIAGNOSIS — M546 Pain in thoracic spine: Secondary | ICD-10-CM | POA: Diagnosis not present

## 2019-03-20 DIAGNOSIS — M9903 Segmental and somatic dysfunction of lumbar region: Secondary | ICD-10-CM | POA: Diagnosis not present

## 2019-03-20 DIAGNOSIS — M542 Cervicalgia: Secondary | ICD-10-CM | POA: Diagnosis not present

## 2019-03-20 DIAGNOSIS — M9901 Segmental and somatic dysfunction of cervical region: Secondary | ICD-10-CM | POA: Diagnosis not present

## 2019-03-20 DIAGNOSIS — M545 Low back pain: Secondary | ICD-10-CM | POA: Diagnosis not present

## 2019-03-20 DIAGNOSIS — M5386 Other specified dorsopathies, lumbar region: Secondary | ICD-10-CM | POA: Diagnosis not present

## 2019-04-12 DIAGNOSIS — M542 Cervicalgia: Secondary | ICD-10-CM | POA: Diagnosis not present

## 2019-04-12 DIAGNOSIS — M9901 Segmental and somatic dysfunction of cervical region: Secondary | ICD-10-CM | POA: Diagnosis not present

## 2019-04-12 DIAGNOSIS — S335XXA Sprain of ligaments of lumbar spine, initial encounter: Secondary | ICD-10-CM | POA: Diagnosis not present

## 2019-04-12 DIAGNOSIS — M546 Pain in thoracic spine: Secondary | ICD-10-CM | POA: Diagnosis not present

## 2019-04-12 DIAGNOSIS — G44209 Tension-type headache, unspecified, not intractable: Secondary | ICD-10-CM | POA: Diagnosis not present

## 2019-04-12 DIAGNOSIS — M9903 Segmental and somatic dysfunction of lumbar region: Secondary | ICD-10-CM | POA: Diagnosis not present

## 2019-04-12 DIAGNOSIS — M545 Low back pain: Secondary | ICD-10-CM | POA: Diagnosis not present

## 2019-04-12 DIAGNOSIS — M5386 Other specified dorsopathies, lumbar region: Secondary | ICD-10-CM | POA: Diagnosis not present

## 2019-04-12 DIAGNOSIS — M9902 Segmental and somatic dysfunction of thoracic region: Secondary | ICD-10-CM | POA: Diagnosis not present

## 2019-04-18 DIAGNOSIS — M5386 Other specified dorsopathies, lumbar region: Secondary | ICD-10-CM | POA: Diagnosis not present

## 2019-04-18 DIAGNOSIS — G44209 Tension-type headache, unspecified, not intractable: Secondary | ICD-10-CM | POA: Diagnosis not present

## 2019-04-18 DIAGNOSIS — M9901 Segmental and somatic dysfunction of cervical region: Secondary | ICD-10-CM | POA: Diagnosis not present

## 2019-04-18 DIAGNOSIS — M9902 Segmental and somatic dysfunction of thoracic region: Secondary | ICD-10-CM | POA: Diagnosis not present

## 2019-04-18 DIAGNOSIS — M9903 Segmental and somatic dysfunction of lumbar region: Secondary | ICD-10-CM | POA: Diagnosis not present

## 2019-04-18 DIAGNOSIS — M546 Pain in thoracic spine: Secondary | ICD-10-CM | POA: Diagnosis not present

## 2019-04-18 DIAGNOSIS — M545 Low back pain: Secondary | ICD-10-CM | POA: Diagnosis not present

## 2019-04-18 DIAGNOSIS — M542 Cervicalgia: Secondary | ICD-10-CM | POA: Diagnosis not present

## 2019-04-18 DIAGNOSIS — S335XXA Sprain of ligaments of lumbar spine, initial encounter: Secondary | ICD-10-CM | POA: Diagnosis not present

## 2019-04-25 DIAGNOSIS — Z79891 Long term (current) use of opiate analgesic: Secondary | ICD-10-CM | POA: Diagnosis not present

## 2019-04-25 DIAGNOSIS — F1994 Other psychoactive substance use, unspecified with psychoactive substance-induced mood disorder: Secondary | ICD-10-CM | POA: Diagnosis not present

## 2019-04-25 DIAGNOSIS — F1120 Opioid dependence, uncomplicated: Secondary | ICD-10-CM | POA: Diagnosis not present

## 2019-04-25 DIAGNOSIS — Z6833 Body mass index (BMI) 33.0-33.9, adult: Secondary | ICD-10-CM | POA: Diagnosis not present

## 2019-04-25 DIAGNOSIS — F11988 Opioid use, unspecified with other opioid-induced disorder: Secondary | ICD-10-CM | POA: Diagnosis not present

## 2019-04-25 DIAGNOSIS — F1123 Opioid dependence with withdrawal: Secondary | ICD-10-CM | POA: Diagnosis not present

## 2019-05-03 DIAGNOSIS — M542 Cervicalgia: Secondary | ICD-10-CM | POA: Diagnosis not present

## 2019-05-03 DIAGNOSIS — M5386 Other specified dorsopathies, lumbar region: Secondary | ICD-10-CM | POA: Diagnosis not present

## 2019-05-03 DIAGNOSIS — G44209 Tension-type headache, unspecified, not intractable: Secondary | ICD-10-CM | POA: Diagnosis not present

## 2019-05-03 DIAGNOSIS — M545 Low back pain: Secondary | ICD-10-CM | POA: Diagnosis not present

## 2019-05-03 DIAGNOSIS — M9902 Segmental and somatic dysfunction of thoracic region: Secondary | ICD-10-CM | POA: Diagnosis not present

## 2019-05-03 DIAGNOSIS — S335XXA Sprain of ligaments of lumbar spine, initial encounter: Secondary | ICD-10-CM | POA: Diagnosis not present

## 2019-05-03 DIAGNOSIS — M9903 Segmental and somatic dysfunction of lumbar region: Secondary | ICD-10-CM | POA: Diagnosis not present

## 2019-05-03 DIAGNOSIS — M9901 Segmental and somatic dysfunction of cervical region: Secondary | ICD-10-CM | POA: Diagnosis not present

## 2019-05-03 DIAGNOSIS — M546 Pain in thoracic spine: Secondary | ICD-10-CM | POA: Diagnosis not present

## 2019-05-04 DIAGNOSIS — M5386 Other specified dorsopathies, lumbar region: Secondary | ICD-10-CM | POA: Diagnosis not present

## 2019-05-04 DIAGNOSIS — M9902 Segmental and somatic dysfunction of thoracic region: Secondary | ICD-10-CM | POA: Diagnosis not present

## 2019-05-04 DIAGNOSIS — M542 Cervicalgia: Secondary | ICD-10-CM | POA: Diagnosis not present

## 2019-05-04 DIAGNOSIS — G44209 Tension-type headache, unspecified, not intractable: Secondary | ICD-10-CM | POA: Diagnosis not present

## 2019-05-04 DIAGNOSIS — M546 Pain in thoracic spine: Secondary | ICD-10-CM | POA: Diagnosis not present

## 2019-05-04 DIAGNOSIS — S335XXA Sprain of ligaments of lumbar spine, initial encounter: Secondary | ICD-10-CM | POA: Diagnosis not present

## 2019-05-04 DIAGNOSIS — M9901 Segmental and somatic dysfunction of cervical region: Secondary | ICD-10-CM | POA: Diagnosis not present

## 2019-05-04 DIAGNOSIS — M9903 Segmental and somatic dysfunction of lumbar region: Secondary | ICD-10-CM | POA: Diagnosis not present

## 2019-05-04 DIAGNOSIS — M545 Low back pain: Secondary | ICD-10-CM | POA: Diagnosis not present

## 2019-05-08 DIAGNOSIS — M9902 Segmental and somatic dysfunction of thoracic region: Secondary | ICD-10-CM | POA: Diagnosis not present

## 2019-05-08 DIAGNOSIS — S335XXA Sprain of ligaments of lumbar spine, initial encounter: Secondary | ICD-10-CM | POA: Diagnosis not present

## 2019-05-08 DIAGNOSIS — M5386 Other specified dorsopathies, lumbar region: Secondary | ICD-10-CM | POA: Diagnosis not present

## 2019-05-08 DIAGNOSIS — M545 Low back pain: Secondary | ICD-10-CM | POA: Diagnosis not present

## 2019-05-08 DIAGNOSIS — M9903 Segmental and somatic dysfunction of lumbar region: Secondary | ICD-10-CM | POA: Diagnosis not present

## 2019-05-08 DIAGNOSIS — G44209 Tension-type headache, unspecified, not intractable: Secondary | ICD-10-CM | POA: Diagnosis not present

## 2019-05-08 DIAGNOSIS — M9901 Segmental and somatic dysfunction of cervical region: Secondary | ICD-10-CM | POA: Diagnosis not present

## 2019-05-08 DIAGNOSIS — M546 Pain in thoracic spine: Secondary | ICD-10-CM | POA: Diagnosis not present

## 2019-05-08 DIAGNOSIS — M542 Cervicalgia: Secondary | ICD-10-CM | POA: Diagnosis not present

## 2019-05-12 DIAGNOSIS — G4733 Obstructive sleep apnea (adult) (pediatric): Secondary | ICD-10-CM | POA: Diagnosis not present

## 2019-05-15 DIAGNOSIS — M9903 Segmental and somatic dysfunction of lumbar region: Secondary | ICD-10-CM | POA: Diagnosis not present

## 2019-05-15 DIAGNOSIS — S335XXA Sprain of ligaments of lumbar spine, initial encounter: Secondary | ICD-10-CM | POA: Diagnosis not present

## 2019-05-15 DIAGNOSIS — M546 Pain in thoracic spine: Secondary | ICD-10-CM | POA: Diagnosis not present

## 2019-05-15 DIAGNOSIS — M5386 Other specified dorsopathies, lumbar region: Secondary | ICD-10-CM | POA: Diagnosis not present

## 2019-05-15 DIAGNOSIS — M9901 Segmental and somatic dysfunction of cervical region: Secondary | ICD-10-CM | POA: Diagnosis not present

## 2019-05-15 DIAGNOSIS — M545 Low back pain: Secondary | ICD-10-CM | POA: Diagnosis not present

## 2019-05-15 DIAGNOSIS — M542 Cervicalgia: Secondary | ICD-10-CM | POA: Diagnosis not present

## 2019-05-15 DIAGNOSIS — G44209 Tension-type headache, unspecified, not intractable: Secondary | ICD-10-CM | POA: Diagnosis not present

## 2019-05-15 DIAGNOSIS — M9902 Segmental and somatic dysfunction of thoracic region: Secondary | ICD-10-CM | POA: Diagnosis not present

## 2019-05-17 DIAGNOSIS — M5416 Radiculopathy, lumbar region: Secondary | ICD-10-CM | POA: Diagnosis not present

## 2019-05-23 DIAGNOSIS — Z79891 Long term (current) use of opiate analgesic: Secondary | ICD-10-CM | POA: Diagnosis not present

## 2019-05-23 DIAGNOSIS — F1994 Other psychoactive substance use, unspecified with psychoactive substance-induced mood disorder: Secondary | ICD-10-CM | POA: Diagnosis not present

## 2019-05-23 DIAGNOSIS — Z6833 Body mass index (BMI) 33.0-33.9, adult: Secondary | ICD-10-CM | POA: Diagnosis not present

## 2019-05-23 DIAGNOSIS — M542 Cervicalgia: Secondary | ICD-10-CM | POA: Diagnosis not present

## 2019-05-23 DIAGNOSIS — G44209 Tension-type headache, unspecified, not intractable: Secondary | ICD-10-CM | POA: Diagnosis not present

## 2019-05-23 DIAGNOSIS — M9901 Segmental and somatic dysfunction of cervical region: Secondary | ICD-10-CM | POA: Diagnosis not present

## 2019-05-23 DIAGNOSIS — M5386 Other specified dorsopathies, lumbar region: Secondary | ICD-10-CM | POA: Diagnosis not present

## 2019-05-23 DIAGNOSIS — M546 Pain in thoracic spine: Secondary | ICD-10-CM | POA: Diagnosis not present

## 2019-05-23 DIAGNOSIS — F1123 Opioid dependence with withdrawal: Secondary | ICD-10-CM | POA: Diagnosis not present

## 2019-05-23 DIAGNOSIS — F1120 Opioid dependence, uncomplicated: Secondary | ICD-10-CM | POA: Diagnosis not present

## 2019-05-23 DIAGNOSIS — M9903 Segmental and somatic dysfunction of lumbar region: Secondary | ICD-10-CM | POA: Diagnosis not present

## 2019-05-23 DIAGNOSIS — M9902 Segmental and somatic dysfunction of thoracic region: Secondary | ICD-10-CM | POA: Diagnosis not present

## 2019-05-23 DIAGNOSIS — M545 Low back pain: Secondary | ICD-10-CM | POA: Diagnosis not present

## 2019-05-23 DIAGNOSIS — F11988 Opioid use, unspecified with other opioid-induced disorder: Secondary | ICD-10-CM | POA: Diagnosis not present

## 2019-05-29 DIAGNOSIS — G44209 Tension-type headache, unspecified, not intractable: Secondary | ICD-10-CM | POA: Diagnosis not present

## 2019-05-29 DIAGNOSIS — M9902 Segmental and somatic dysfunction of thoracic region: Secondary | ICD-10-CM | POA: Diagnosis not present

## 2019-05-29 DIAGNOSIS — M9901 Segmental and somatic dysfunction of cervical region: Secondary | ICD-10-CM | POA: Diagnosis not present

## 2019-05-29 DIAGNOSIS — M9903 Segmental and somatic dysfunction of lumbar region: Secondary | ICD-10-CM | POA: Diagnosis not present

## 2019-05-29 DIAGNOSIS — M545 Low back pain: Secondary | ICD-10-CM | POA: Diagnosis not present

## 2019-05-29 DIAGNOSIS — M546 Pain in thoracic spine: Secondary | ICD-10-CM | POA: Diagnosis not present

## 2019-05-29 DIAGNOSIS — M5386 Other specified dorsopathies, lumbar region: Secondary | ICD-10-CM | POA: Diagnosis not present

## 2019-05-29 DIAGNOSIS — M542 Cervicalgia: Secondary | ICD-10-CM | POA: Diagnosis not present

## 2019-06-20 DIAGNOSIS — M9902 Segmental and somatic dysfunction of thoracic region: Secondary | ICD-10-CM | POA: Diagnosis not present

## 2019-06-20 DIAGNOSIS — G44209 Tension-type headache, unspecified, not intractable: Secondary | ICD-10-CM | POA: Diagnosis not present

## 2019-06-20 DIAGNOSIS — M9903 Segmental and somatic dysfunction of lumbar region: Secondary | ICD-10-CM | POA: Diagnosis not present

## 2019-06-20 DIAGNOSIS — M5386 Other specified dorsopathies, lumbar region: Secondary | ICD-10-CM | POA: Diagnosis not present

## 2019-06-20 DIAGNOSIS — M546 Pain in thoracic spine: Secondary | ICD-10-CM | POA: Diagnosis not present

## 2019-06-20 DIAGNOSIS — M545 Low back pain: Secondary | ICD-10-CM | POA: Diagnosis not present

## 2019-06-20 DIAGNOSIS — M9901 Segmental and somatic dysfunction of cervical region: Secondary | ICD-10-CM | POA: Diagnosis not present

## 2019-06-20 DIAGNOSIS — M542 Cervicalgia: Secondary | ICD-10-CM | POA: Diagnosis not present

## 2019-06-21 DIAGNOSIS — M5416 Radiculopathy, lumbar region: Secondary | ICD-10-CM | POA: Diagnosis not present

## 2019-06-21 DIAGNOSIS — M5126 Other intervertebral disc displacement, lumbar region: Secondary | ICD-10-CM | POA: Diagnosis not present

## 2019-06-27 DIAGNOSIS — M5416 Radiculopathy, lumbar region: Secondary | ICD-10-CM | POA: Diagnosis not present

## 2019-07-04 DIAGNOSIS — F1994 Other psychoactive substance use, unspecified with psychoactive substance-induced mood disorder: Secondary | ICD-10-CM | POA: Diagnosis not present

## 2019-07-04 DIAGNOSIS — Z79891 Long term (current) use of opiate analgesic: Secondary | ICD-10-CM | POA: Diagnosis not present

## 2019-07-04 DIAGNOSIS — Z6833 Body mass index (BMI) 33.0-33.9, adult: Secondary | ICD-10-CM | POA: Diagnosis not present

## 2019-07-04 DIAGNOSIS — F1120 Opioid dependence, uncomplicated: Secondary | ICD-10-CM | POA: Diagnosis not present

## 2019-07-04 DIAGNOSIS — F1123 Opioid dependence with withdrawal: Secondary | ICD-10-CM | POA: Diagnosis not present

## 2020-02-16 DIAGNOSIS — Z6832 Body mass index (BMI) 32.0-32.9, adult: Secondary | ICD-10-CM | POA: Diagnosis not present

## 2020-02-16 DIAGNOSIS — F4010 Social phobia, unspecified: Secondary | ICD-10-CM | POA: Diagnosis not present

## 2020-02-16 DIAGNOSIS — K2100 Gastro-esophageal reflux disease with esophagitis, without bleeding: Secondary | ICD-10-CM | POA: Diagnosis not present

## 2020-03-26 DIAGNOSIS — F1994 Other psychoactive substance use, unspecified with psychoactive substance-induced mood disorder: Secondary | ICD-10-CM | POA: Diagnosis not present

## 2020-03-26 DIAGNOSIS — F1123 Opioid dependence with withdrawal: Secondary | ICD-10-CM | POA: Diagnosis not present

## 2020-03-26 DIAGNOSIS — F1120 Opioid dependence, uncomplicated: Secondary | ICD-10-CM | POA: Diagnosis not present

## 2020-03-26 DIAGNOSIS — E669 Obesity, unspecified: Secondary | ICD-10-CM | POA: Diagnosis not present

## 2020-03-26 DIAGNOSIS — Z6832 Body mass index (BMI) 32.0-32.9, adult: Secondary | ICD-10-CM | POA: Diagnosis not present

## 2020-03-26 DIAGNOSIS — Z79891 Long term (current) use of opiate analgesic: Secondary | ICD-10-CM | POA: Diagnosis not present

## 2020-03-26 DIAGNOSIS — F11988 Opioid use, unspecified with other opioid-induced disorder: Secondary | ICD-10-CM | POA: Diagnosis not present

## 2020-04-17 DIAGNOSIS — Z79891 Long term (current) use of opiate analgesic: Secondary | ICD-10-CM | POA: Diagnosis not present

## 2020-04-17 DIAGNOSIS — F11988 Opioid use, unspecified with other opioid-induced disorder: Secondary | ICD-10-CM | POA: Diagnosis not present

## 2020-04-17 DIAGNOSIS — Z6832 Body mass index (BMI) 32.0-32.9, adult: Secondary | ICD-10-CM | POA: Diagnosis not present

## 2020-04-17 DIAGNOSIS — F1994 Other psychoactive substance use, unspecified with psychoactive substance-induced mood disorder: Secondary | ICD-10-CM | POA: Diagnosis not present

## 2020-04-17 DIAGNOSIS — Z87891 Personal history of nicotine dependence: Secondary | ICD-10-CM | POA: Diagnosis not present

## 2020-04-17 DIAGNOSIS — F1120 Opioid dependence, uncomplicated: Secondary | ICD-10-CM | POA: Diagnosis not present

## 2020-04-17 DIAGNOSIS — F1123 Opioid dependence with withdrawal: Secondary | ICD-10-CM | POA: Diagnosis not present

## 2020-05-15 DIAGNOSIS — F1121 Opioid dependence, in remission: Secondary | ICD-10-CM | POA: Diagnosis not present

## 2020-05-15 DIAGNOSIS — F411 Generalized anxiety disorder: Secondary | ICD-10-CM | POA: Diagnosis not present

## 2020-05-15 DIAGNOSIS — E669 Obesity, unspecified: Secondary | ICD-10-CM | POA: Diagnosis not present

## 2020-05-15 DIAGNOSIS — F331 Major depressive disorder, recurrent, moderate: Secondary | ICD-10-CM | POA: Diagnosis not present

## 2020-05-15 DIAGNOSIS — Z79891 Long term (current) use of opiate analgesic: Secondary | ICD-10-CM | POA: Diagnosis not present

## 2020-05-15 DIAGNOSIS — Z6831 Body mass index (BMI) 31.0-31.9, adult: Secondary | ICD-10-CM | POA: Diagnosis not present

## 2020-05-29 DIAGNOSIS — E669 Obesity, unspecified: Secondary | ICD-10-CM | POA: Diagnosis not present

## 2020-05-29 DIAGNOSIS — Z6831 Body mass index (BMI) 31.0-31.9, adult: Secondary | ICD-10-CM | POA: Diagnosis not present

## 2020-05-29 DIAGNOSIS — F331 Major depressive disorder, recurrent, moderate: Secondary | ICD-10-CM | POA: Diagnosis not present

## 2020-05-29 DIAGNOSIS — F411 Generalized anxiety disorder: Secondary | ICD-10-CM | POA: Diagnosis not present

## 2020-05-29 DIAGNOSIS — F1121 Opioid dependence, in remission: Secondary | ICD-10-CM | POA: Diagnosis not present

## 2020-05-29 DIAGNOSIS — Z79891 Long term (current) use of opiate analgesic: Secondary | ICD-10-CM | POA: Diagnosis not present

## 2020-06-12 DIAGNOSIS — Z79891 Long term (current) use of opiate analgesic: Secondary | ICD-10-CM | POA: Diagnosis not present

## 2020-06-12 DIAGNOSIS — F331 Major depressive disorder, recurrent, moderate: Secondary | ICD-10-CM | POA: Diagnosis not present

## 2020-06-12 DIAGNOSIS — F411 Generalized anxiety disorder: Secondary | ICD-10-CM | POA: Diagnosis not present

## 2020-06-12 DIAGNOSIS — F1121 Opioid dependence, in remission: Secondary | ICD-10-CM | POA: Diagnosis not present

## 2020-06-26 DIAGNOSIS — Z6831 Body mass index (BMI) 31.0-31.9, adult: Secondary | ICD-10-CM | POA: Diagnosis not present

## 2020-06-26 DIAGNOSIS — F411 Generalized anxiety disorder: Secondary | ICD-10-CM | POA: Diagnosis not present

## 2020-06-26 DIAGNOSIS — F331 Major depressive disorder, recurrent, moderate: Secondary | ICD-10-CM | POA: Diagnosis not present

## 2020-06-26 DIAGNOSIS — Z79891 Long term (current) use of opiate analgesic: Secondary | ICD-10-CM | POA: Diagnosis not present

## 2020-06-26 DIAGNOSIS — F1121 Opioid dependence, in remission: Secondary | ICD-10-CM | POA: Diagnosis not present

## 2020-07-10 DIAGNOSIS — F331 Major depressive disorder, recurrent, moderate: Secondary | ICD-10-CM | POA: Diagnosis not present

## 2020-07-10 DIAGNOSIS — Z6831 Body mass index (BMI) 31.0-31.9, adult: Secondary | ICD-10-CM | POA: Diagnosis not present

## 2020-07-10 DIAGNOSIS — F411 Generalized anxiety disorder: Secondary | ICD-10-CM | POA: Diagnosis not present

## 2020-07-10 DIAGNOSIS — F1121 Opioid dependence, in remission: Secondary | ICD-10-CM | POA: Diagnosis not present

## 2020-07-10 DIAGNOSIS — Z79891 Long term (current) use of opiate analgesic: Secondary | ICD-10-CM | POA: Diagnosis not present

## 2020-07-24 DIAGNOSIS — Z79891 Long term (current) use of opiate analgesic: Secondary | ICD-10-CM | POA: Diagnosis not present

## 2020-07-24 DIAGNOSIS — F1121 Opioid dependence, in remission: Secondary | ICD-10-CM | POA: Diagnosis not present

## 2020-07-24 DIAGNOSIS — F331 Major depressive disorder, recurrent, moderate: Secondary | ICD-10-CM | POA: Diagnosis not present

## 2020-07-24 DIAGNOSIS — F411 Generalized anxiety disorder: Secondary | ICD-10-CM | POA: Diagnosis not present

## 2020-08-07 DIAGNOSIS — Z79891 Long term (current) use of opiate analgesic: Secondary | ICD-10-CM | POA: Diagnosis not present

## 2020-08-07 DIAGNOSIS — F331 Major depressive disorder, recurrent, moderate: Secondary | ICD-10-CM | POA: Diagnosis not present

## 2020-08-07 DIAGNOSIS — Z6831 Body mass index (BMI) 31.0-31.9, adult: Secondary | ICD-10-CM | POA: Diagnosis not present

## 2020-08-07 DIAGNOSIS — F411 Generalized anxiety disorder: Secondary | ICD-10-CM | POA: Diagnosis not present

## 2020-08-07 DIAGNOSIS — F1121 Opioid dependence, in remission: Secondary | ICD-10-CM | POA: Diagnosis not present

## 2020-08-21 DIAGNOSIS — F1121 Opioid dependence, in remission: Secondary | ICD-10-CM | POA: Diagnosis not present

## 2020-08-21 DIAGNOSIS — Z6831 Body mass index (BMI) 31.0-31.9, adult: Secondary | ICD-10-CM | POA: Diagnosis not present

## 2020-08-21 DIAGNOSIS — F411 Generalized anxiety disorder: Secondary | ICD-10-CM | POA: Diagnosis not present

## 2020-08-21 DIAGNOSIS — Z79891 Long term (current) use of opiate analgesic: Secondary | ICD-10-CM | POA: Diagnosis not present

## 2020-08-21 DIAGNOSIS — F331 Major depressive disorder, recurrent, moderate: Secondary | ICD-10-CM | POA: Diagnosis not present

## 2020-09-04 DIAGNOSIS — Z79891 Long term (current) use of opiate analgesic: Secondary | ICD-10-CM | POA: Diagnosis not present

## 2020-09-04 DIAGNOSIS — F411 Generalized anxiety disorder: Secondary | ICD-10-CM | POA: Diagnosis not present

## 2020-09-04 DIAGNOSIS — Z6831 Body mass index (BMI) 31.0-31.9, adult: Secondary | ICD-10-CM | POA: Diagnosis not present

## 2020-09-04 DIAGNOSIS — F331 Major depressive disorder, recurrent, moderate: Secondary | ICD-10-CM | POA: Diagnosis not present

## 2020-09-04 DIAGNOSIS — F1121 Opioid dependence, in remission: Secondary | ICD-10-CM | POA: Diagnosis not present

## 2020-09-11 DIAGNOSIS — K449 Diaphragmatic hernia without obstruction or gangrene: Secondary | ICD-10-CM | POA: Diagnosis not present

## 2020-09-11 DIAGNOSIS — G4733 Obstructive sleep apnea (adult) (pediatric): Secondary | ICD-10-CM | POA: Diagnosis not present

## 2020-09-11 DIAGNOSIS — N5082 Scrotal pain: Secondary | ICD-10-CM | POA: Diagnosis not present

## 2020-09-11 DIAGNOSIS — E7849 Other hyperlipidemia: Secondary | ICD-10-CM | POA: Diagnosis not present

## 2020-09-11 DIAGNOSIS — F17218 Nicotine dependence, cigarettes, with other nicotine-induced disorders: Secondary | ICD-10-CM | POA: Diagnosis not present

## 2020-09-11 DIAGNOSIS — M5136 Other intervertebral disc degeneration, lumbar region: Secondary | ICD-10-CM | POA: Diagnosis not present

## 2020-09-11 DIAGNOSIS — E6609 Other obesity due to excess calories: Secondary | ICD-10-CM | POA: Diagnosis not present

## 2020-09-11 DIAGNOSIS — F1121 Opioid dependence, in remission: Secondary | ICD-10-CM | POA: Diagnosis not present

## 2020-09-11 DIAGNOSIS — K219 Gastro-esophageal reflux disease without esophagitis: Secondary | ICD-10-CM | POA: Diagnosis not present

## 2020-09-18 DIAGNOSIS — F331 Major depressive disorder, recurrent, moderate: Secondary | ICD-10-CM | POA: Diagnosis not present

## 2020-09-18 DIAGNOSIS — F411 Generalized anxiety disorder: Secondary | ICD-10-CM | POA: Diagnosis not present

## 2020-09-18 DIAGNOSIS — F1121 Opioid dependence, in remission: Secondary | ICD-10-CM | POA: Diagnosis not present

## 2020-09-18 DIAGNOSIS — Z79891 Long term (current) use of opiate analgesic: Secondary | ICD-10-CM | POA: Diagnosis not present

## 2020-10-02 DIAGNOSIS — F411 Generalized anxiety disorder: Secondary | ICD-10-CM | POA: Diagnosis not present

## 2020-10-02 DIAGNOSIS — Z79891 Long term (current) use of opiate analgesic: Secondary | ICD-10-CM | POA: Diagnosis not present

## 2020-10-02 DIAGNOSIS — F331 Major depressive disorder, recurrent, moderate: Secondary | ICD-10-CM | POA: Diagnosis not present

## 2020-10-02 DIAGNOSIS — Z6831 Body mass index (BMI) 31.0-31.9, adult: Secondary | ICD-10-CM | POA: Diagnosis not present

## 2020-10-02 DIAGNOSIS — F1121 Opioid dependence, in remission: Secondary | ICD-10-CM | POA: Diagnosis not present

## 2020-10-03 DIAGNOSIS — G4733 Obstructive sleep apnea (adult) (pediatric): Secondary | ICD-10-CM | POA: Diagnosis not present

## 2020-10-10 DIAGNOSIS — N5089 Other specified disorders of the male genital organs: Secondary | ICD-10-CM | POA: Diagnosis not present

## 2020-10-10 DIAGNOSIS — N5082 Scrotal pain: Secondary | ICD-10-CM | POA: Diagnosis not present

## 2020-10-10 DIAGNOSIS — N503 Cyst of epididymis: Secondary | ICD-10-CM | POA: Diagnosis not present

## 2020-10-10 DIAGNOSIS — R1031 Right lower quadrant pain: Secondary | ICD-10-CM | POA: Diagnosis not present

## 2020-10-10 DIAGNOSIS — N433 Hydrocele, unspecified: Secondary | ICD-10-CM | POA: Diagnosis not present

## 2021-09-25 DIAGNOSIS — E7849 Other hyperlipidemia: Secondary | ICD-10-CM | POA: Diagnosis not present

## 2021-09-25 DIAGNOSIS — E6609 Other obesity due to excess calories: Secondary | ICD-10-CM | POA: Diagnosis not present

## 2021-09-25 DIAGNOSIS — K219 Gastro-esophageal reflux disease without esophagitis: Secondary | ICD-10-CM | POA: Diagnosis not present

## 2021-09-25 DIAGNOSIS — R7309 Other abnormal glucose: Secondary | ICD-10-CM | POA: Diagnosis not present

## 2021-09-25 DIAGNOSIS — K5909 Other constipation: Secondary | ICD-10-CM | POA: Diagnosis not present

## 2021-09-25 DIAGNOSIS — R109 Unspecified abdominal pain: Secondary | ICD-10-CM | POA: Diagnosis not present

## 2021-09-25 DIAGNOSIS — F17210 Nicotine dependence, cigarettes, uncomplicated: Secondary | ICD-10-CM | POA: Diagnosis not present

## 2021-09-25 DIAGNOSIS — F1121 Opioid dependence, in remission: Secondary | ICD-10-CM | POA: Diagnosis not present

## 2021-09-25 DIAGNOSIS — R5383 Other fatigue: Secondary | ICD-10-CM | POA: Diagnosis not present

## 2021-09-25 DIAGNOSIS — E871 Hypo-osmolality and hyponatremia: Secondary | ICD-10-CM | POA: Diagnosis not present

## 2021-10-07 DIAGNOSIS — F331 Major depressive disorder, recurrent, moderate: Secondary | ICD-10-CM | POA: Diagnosis not present

## 2021-10-07 DIAGNOSIS — F1120 Opioid dependence, uncomplicated: Secondary | ICD-10-CM | POA: Diagnosis not present

## 2021-10-07 DIAGNOSIS — F411 Generalized anxiety disorder: Secondary | ICD-10-CM | POA: Diagnosis not present

## 2021-10-09 DIAGNOSIS — L74510 Primary focal hyperhidrosis, axilla: Secondary | ICD-10-CM | POA: Diagnosis not present

## 2021-10-09 DIAGNOSIS — L304 Erythema intertrigo: Secondary | ICD-10-CM | POA: Diagnosis not present

## 2021-10-09 DIAGNOSIS — L720 Epidermal cyst: Secondary | ICD-10-CM | POA: Diagnosis not present

## 2021-11-18 DIAGNOSIS — F411 Generalized anxiety disorder: Secondary | ICD-10-CM | POA: Diagnosis not present

## 2021-11-18 DIAGNOSIS — F331 Major depressive disorder, recurrent, moderate: Secondary | ICD-10-CM | POA: Diagnosis not present

## 2021-11-18 DIAGNOSIS — F1120 Opioid dependence, uncomplicated: Secondary | ICD-10-CM | POA: Diagnosis not present

## 2021-11-27 DIAGNOSIS — K219 Gastro-esophageal reflux disease without esophagitis: Secondary | ICD-10-CM | POA: Diagnosis not present

## 2021-11-27 DIAGNOSIS — J329 Chronic sinusitis, unspecified: Secondary | ICD-10-CM | POA: Diagnosis not present

## 2021-11-27 DIAGNOSIS — F17210 Nicotine dependence, cigarettes, uncomplicated: Secondary | ICD-10-CM | POA: Diagnosis not present

## 2021-11-27 DIAGNOSIS — K5909 Other constipation: Secondary | ICD-10-CM | POA: Diagnosis not present

## 2021-11-27 DIAGNOSIS — R0982 Postnasal drip: Secondary | ICD-10-CM | POA: Diagnosis not present

## 2021-11-27 DIAGNOSIS — G4733 Obstructive sleep apnea (adult) (pediatric): Secondary | ICD-10-CM | POA: Diagnosis not present

## 2021-12-09 DIAGNOSIS — F331 Major depressive disorder, recurrent, moderate: Secondary | ICD-10-CM | POA: Diagnosis not present

## 2021-12-09 DIAGNOSIS — F411 Generalized anxiety disorder: Secondary | ICD-10-CM | POA: Diagnosis not present

## 2021-12-09 DIAGNOSIS — F1120 Opioid dependence, uncomplicated: Secondary | ICD-10-CM | POA: Diagnosis not present

## 2021-12-09 DIAGNOSIS — Z79891 Long term (current) use of opiate analgesic: Secondary | ICD-10-CM | POA: Diagnosis not present

## 2021-12-09 DIAGNOSIS — Z6833 Body mass index (BMI) 33.0-33.9, adult: Secondary | ICD-10-CM | POA: Diagnosis not present

## 2021-12-27 DIAGNOSIS — R109 Unspecified abdominal pain: Secondary | ICD-10-CM | POA: Diagnosis not present

## 2021-12-27 DIAGNOSIS — Z91030 Bee allergy status: Secondary | ICD-10-CM | POA: Diagnosis not present

## 2021-12-27 DIAGNOSIS — K4090 Unilateral inguinal hernia, without obstruction or gangrene, not specified as recurrent: Secondary | ICD-10-CM | POA: Diagnosis not present

## 2021-12-27 DIAGNOSIS — R1031 Right lower quadrant pain: Secondary | ICD-10-CM | POA: Diagnosis not present

## 2021-12-27 DIAGNOSIS — Z886 Allergy status to analgesic agent status: Secondary | ICD-10-CM | POA: Diagnosis not present

## 2021-12-27 DIAGNOSIS — K6389 Other specified diseases of intestine: Secondary | ICD-10-CM | POA: Diagnosis not present

## 2021-12-27 DIAGNOSIS — K5229 Other allergic and dietetic gastroenteritis and colitis: Secondary | ICD-10-CM | POA: Diagnosis not present

## 2021-12-27 DIAGNOSIS — K429 Umbilical hernia without obstruction or gangrene: Secondary | ICD-10-CM | POA: Diagnosis not present

## 2021-12-27 DIAGNOSIS — Z885 Allergy status to narcotic agent status: Secondary | ICD-10-CM | POA: Diagnosis not present

## 2021-12-27 DIAGNOSIS — F17210 Nicotine dependence, cigarettes, uncomplicated: Secondary | ICD-10-CM | POA: Diagnosis not present

## 2021-12-30 DIAGNOSIS — F1120 Opioid dependence, uncomplicated: Secondary | ICD-10-CM | POA: Diagnosis not present

## 2021-12-30 DIAGNOSIS — F331 Major depressive disorder, recurrent, moderate: Secondary | ICD-10-CM | POA: Diagnosis not present

## 2021-12-30 DIAGNOSIS — F411 Generalized anxiety disorder: Secondary | ICD-10-CM | POA: Diagnosis not present

## 2022-01-01 DIAGNOSIS — R1031 Right lower quadrant pain: Secondary | ICD-10-CM | POA: Diagnosis not present

## 2022-01-20 DIAGNOSIS — Z79891 Long term (current) use of opiate analgesic: Secondary | ICD-10-CM | POA: Diagnosis not present

## 2022-01-20 DIAGNOSIS — Z6833 Body mass index (BMI) 33.0-33.9, adult: Secondary | ICD-10-CM | POA: Diagnosis not present

## 2022-01-20 DIAGNOSIS — F411 Generalized anxiety disorder: Secondary | ICD-10-CM | POA: Diagnosis not present

## 2022-01-20 DIAGNOSIS — F1120 Opioid dependence, uncomplicated: Secondary | ICD-10-CM | POA: Diagnosis not present

## 2022-01-20 DIAGNOSIS — F331 Major depressive disorder, recurrent, moderate: Secondary | ICD-10-CM | POA: Diagnosis not present

## 2022-02-04 DIAGNOSIS — G4733 Obstructive sleep apnea (adult) (pediatric): Secondary | ICD-10-CM | POA: Diagnosis not present

## 2022-02-10 DIAGNOSIS — F17210 Nicotine dependence, cigarettes, uncomplicated: Secondary | ICD-10-CM | POA: Diagnosis not present

## 2022-02-10 DIAGNOSIS — J329 Chronic sinusitis, unspecified: Secondary | ICD-10-CM | POA: Diagnosis not present

## 2022-02-10 DIAGNOSIS — G4733 Obstructive sleep apnea (adult) (pediatric): Secondary | ICD-10-CM | POA: Diagnosis not present

## 2022-02-10 DIAGNOSIS — F411 Generalized anxiety disorder: Secondary | ICD-10-CM | POA: Diagnosis not present

## 2022-02-10 DIAGNOSIS — F331 Major depressive disorder, recurrent, moderate: Secondary | ICD-10-CM | POA: Diagnosis not present

## 2022-02-10 DIAGNOSIS — R0982 Postnasal drip: Secondary | ICD-10-CM | POA: Diagnosis not present

## 2022-02-10 DIAGNOSIS — F1120 Opioid dependence, uncomplicated: Secondary | ICD-10-CM | POA: Diagnosis not present

## 2022-02-12 DIAGNOSIS — L738 Other specified follicular disorders: Secondary | ICD-10-CM | POA: Diagnosis not present

## 2022-02-12 DIAGNOSIS — L720 Epidermal cyst: Secondary | ICD-10-CM | POA: Diagnosis not present

## 2022-03-25 DIAGNOSIS — K5909 Other constipation: Secondary | ICD-10-CM | POA: Diagnosis not present

## 2022-03-25 DIAGNOSIS — E291 Testicular hypofunction: Secondary | ICD-10-CM | POA: Diagnosis not present

## 2022-03-25 DIAGNOSIS — Z79899 Other long term (current) drug therapy: Secondary | ICD-10-CM | POA: Diagnosis not present

## 2022-03-25 DIAGNOSIS — Z125 Encounter for screening for malignant neoplasm of prostate: Secondary | ICD-10-CM | POA: Diagnosis not present

## 2022-03-25 DIAGNOSIS — R5383 Other fatigue: Secondary | ICD-10-CM | POA: Diagnosis not present

## 2022-03-25 DIAGNOSIS — E6609 Other obesity due to excess calories: Secondary | ICD-10-CM | POA: Diagnosis not present

## 2022-03-25 DIAGNOSIS — K219 Gastro-esophageal reflux disease without esophagitis: Secondary | ICD-10-CM | POA: Diagnosis not present

## 2022-03-25 DIAGNOSIS — Z6830 Body mass index (BMI) 30.0-30.9, adult: Secondary | ICD-10-CM | POA: Diagnosis not present

## 2022-03-25 DIAGNOSIS — Z86010 Personal history of colonic polyps: Secondary | ICD-10-CM | POA: Diagnosis not present

## 2022-03-25 DIAGNOSIS — R935 Abnormal findings on diagnostic imaging of other abdominal regions, including retroperitoneum: Secondary | ICD-10-CM | POA: Diagnosis not present

## 2022-03-26 DIAGNOSIS — K219 Gastro-esophageal reflux disease without esophagitis: Secondary | ICD-10-CM | POA: Diagnosis not present

## 2022-03-26 DIAGNOSIS — R5383 Other fatigue: Secondary | ICD-10-CM | POA: Diagnosis not present

## 2022-03-26 DIAGNOSIS — E291 Testicular hypofunction: Secondary | ICD-10-CM | POA: Diagnosis not present

## 2022-03-26 DIAGNOSIS — E7849 Other hyperlipidemia: Secondary | ICD-10-CM | POA: Diagnosis not present

## 2022-03-26 DIAGNOSIS — F17210 Nicotine dependence, cigarettes, uncomplicated: Secondary | ICD-10-CM | POA: Diagnosis not present

## 2022-04-06 DIAGNOSIS — K219 Gastro-esophageal reflux disease without esophagitis: Secondary | ICD-10-CM | POA: Diagnosis not present

## 2022-04-06 DIAGNOSIS — Z1211 Encounter for screening for malignant neoplasm of colon: Secondary | ICD-10-CM | POA: Diagnosis not present

## 2022-04-06 DIAGNOSIS — F419 Anxiety disorder, unspecified: Secondary | ICD-10-CM | POA: Diagnosis not present

## 2022-04-06 DIAGNOSIS — Z09 Encounter for follow-up examination after completed treatment for conditions other than malignant neoplasm: Secondary | ICD-10-CM | POA: Diagnosis not present

## 2022-04-06 DIAGNOSIS — Z86010 Personal history of colonic polyps: Secondary | ICD-10-CM | POA: Diagnosis not present

## 2022-04-09 DIAGNOSIS — Z6833 Body mass index (BMI) 33.0-33.9, adult: Secondary | ICD-10-CM | POA: Diagnosis not present

## 2022-04-09 DIAGNOSIS — F1120 Opioid dependence, uncomplicated: Secondary | ICD-10-CM | POA: Diagnosis not present

## 2022-04-09 DIAGNOSIS — Z79891 Long term (current) use of opiate analgesic: Secondary | ICD-10-CM | POA: Diagnosis not present

## 2022-04-09 DIAGNOSIS — F411 Generalized anxiety disorder: Secondary | ICD-10-CM | POA: Diagnosis not present

## 2022-04-14 DIAGNOSIS — R5383 Other fatigue: Secondary | ICD-10-CM | POA: Diagnosis not present

## 2022-04-14 DIAGNOSIS — F411 Generalized anxiety disorder: Secondary | ICD-10-CM | POA: Diagnosis not present

## 2022-04-14 DIAGNOSIS — F1121 Opioid dependence, in remission: Secondary | ICD-10-CM | POA: Diagnosis not present

## 2022-04-14 DIAGNOSIS — R6882 Decreased libido: Secondary | ICD-10-CM | POA: Diagnosis not present

## 2022-04-14 DIAGNOSIS — R7989 Other specified abnormal findings of blood chemistry: Secondary | ICD-10-CM | POA: Diagnosis not present

## 2022-05-06 DIAGNOSIS — H9203 Otalgia, bilateral: Secondary | ICD-10-CM | POA: Diagnosis not present

## 2022-05-06 DIAGNOSIS — J309 Allergic rhinitis, unspecified: Secondary | ICD-10-CM | POA: Diagnosis not present

## 2022-05-07 DIAGNOSIS — F411 Generalized anxiety disorder: Secondary | ICD-10-CM | POA: Diagnosis not present

## 2022-05-07 DIAGNOSIS — F331 Major depressive disorder, recurrent, moderate: Secondary | ICD-10-CM | POA: Diagnosis not present

## 2022-05-07 DIAGNOSIS — F1120 Opioid dependence, uncomplicated: Secondary | ICD-10-CM | POA: Diagnosis not present

## 2022-05-19 DIAGNOSIS — F17219 Nicotine dependence, cigarettes, with unspecified nicotine-induced disorders: Secondary | ICD-10-CM | POA: Diagnosis not present

## 2022-05-19 DIAGNOSIS — L089 Local infection of the skin and subcutaneous tissue, unspecified: Secondary | ICD-10-CM | POA: Diagnosis not present

## 2022-05-19 DIAGNOSIS — S0031XS Abrasion of nose, sequela: Secondary | ICD-10-CM | POA: Diagnosis not present

## 2022-05-19 DIAGNOSIS — T782XXS Anaphylactic shock, unspecified, sequela: Secondary | ICD-10-CM | POA: Diagnosis not present

## 2022-05-19 DIAGNOSIS — R252 Cramp and spasm: Secondary | ICD-10-CM | POA: Diagnosis not present

## 2022-05-19 DIAGNOSIS — J309 Allergic rhinitis, unspecified: Secondary | ICD-10-CM | POA: Diagnosis not present

## 2022-05-19 DIAGNOSIS — Z0000 Encounter for general adult medical examination without abnormal findings: Secondary | ICD-10-CM | POA: Diagnosis not present

## 2022-05-21 DIAGNOSIS — F410 Panic disorder [episodic paroxysmal anxiety] without agoraphobia: Secondary | ICD-10-CM | POA: Diagnosis not present

## 2022-05-21 DIAGNOSIS — F331 Major depressive disorder, recurrent, moderate: Secondary | ICD-10-CM | POA: Diagnosis not present

## 2022-05-21 DIAGNOSIS — F411 Generalized anxiety disorder: Secondary | ICD-10-CM | POA: Diagnosis not present

## 2022-05-26 DIAGNOSIS — R5383 Other fatigue: Secondary | ICD-10-CM | POA: Diagnosis not present

## 2022-05-26 DIAGNOSIS — R6882 Decreased libido: Secondary | ICD-10-CM | POA: Diagnosis not present

## 2022-05-26 DIAGNOSIS — R7989 Other specified abnormal findings of blood chemistry: Secondary | ICD-10-CM | POA: Diagnosis not present

## 2022-05-26 DIAGNOSIS — F411 Generalized anxiety disorder: Secondary | ICD-10-CM | POA: Diagnosis not present

## 2022-05-26 DIAGNOSIS — F1121 Opioid dependence, in remission: Secondary | ICD-10-CM | POA: Diagnosis not present

## 2022-06-01 DIAGNOSIS — F331 Major depressive disorder, recurrent, moderate: Secondary | ICD-10-CM | POA: Diagnosis not present

## 2022-06-01 DIAGNOSIS — Z79891 Long term (current) use of opiate analgesic: Secondary | ICD-10-CM | POA: Diagnosis not present

## 2022-06-01 DIAGNOSIS — F1120 Opioid dependence, uncomplicated: Secondary | ICD-10-CM | POA: Diagnosis not present

## 2022-06-01 DIAGNOSIS — Z6833 Body mass index (BMI) 33.0-33.9, adult: Secondary | ICD-10-CM | POA: Diagnosis not present

## 2022-06-01 DIAGNOSIS — F411 Generalized anxiety disorder: Secondary | ICD-10-CM | POA: Diagnosis not present

## 2022-06-22 DIAGNOSIS — E870 Hyperosmolality and hypernatremia: Secondary | ICD-10-CM | POA: Diagnosis not present

## 2022-06-22 DIAGNOSIS — K219 Gastro-esophageal reflux disease without esophagitis: Secondary | ICD-10-CM | POA: Diagnosis not present

## 2022-06-22 DIAGNOSIS — M2550 Pain in unspecified joint: Secondary | ICD-10-CM | POA: Diagnosis not present

## 2022-06-23 DIAGNOSIS — F411 Generalized anxiety disorder: Secondary | ICD-10-CM | POA: Diagnosis not present

## 2022-06-23 DIAGNOSIS — F331 Major depressive disorder, recurrent, moderate: Secondary | ICD-10-CM | POA: Diagnosis not present

## 2022-06-23 DIAGNOSIS — Z6833 Body mass index (BMI) 33.0-33.9, adult: Secondary | ICD-10-CM | POA: Diagnosis not present

## 2022-06-23 DIAGNOSIS — F1120 Opioid dependence, uncomplicated: Secondary | ICD-10-CM | POA: Diagnosis not present

## 2022-06-23 DIAGNOSIS — F17219 Nicotine dependence, cigarettes, with unspecified nicotine-induced disorders: Secondary | ICD-10-CM | POA: Diagnosis not present

## 2022-06-23 DIAGNOSIS — F17210 Nicotine dependence, cigarettes, uncomplicated: Secondary | ICD-10-CM | POA: Diagnosis not present

## 2022-06-23 DIAGNOSIS — Z79891 Long term (current) use of opiate analgesic: Secondary | ICD-10-CM | POA: Diagnosis not present

## 2022-07-14 DIAGNOSIS — F331 Major depressive disorder, recurrent, moderate: Secondary | ICD-10-CM | POA: Diagnosis not present

## 2022-07-14 DIAGNOSIS — Z6833 Body mass index (BMI) 33.0-33.9, adult: Secondary | ICD-10-CM | POA: Diagnosis not present

## 2022-07-14 DIAGNOSIS — F1120 Opioid dependence, uncomplicated: Secondary | ICD-10-CM | POA: Diagnosis not present

## 2022-07-22 DIAGNOSIS — G894 Chronic pain syndrome: Secondary | ICD-10-CM | POA: Diagnosis not present

## 2022-07-22 DIAGNOSIS — M5441 Lumbago with sciatica, right side: Secondary | ICD-10-CM | POA: Diagnosis not present

## 2022-07-22 DIAGNOSIS — M5136 Other intervertebral disc degeneration, lumbar region: Secondary | ICD-10-CM | POA: Diagnosis not present

## 2022-07-22 DIAGNOSIS — G8929 Other chronic pain: Secondary | ICD-10-CM | POA: Diagnosis not present

## 2022-07-22 DIAGNOSIS — M47816 Spondylosis without myelopathy or radiculopathy, lumbar region: Secondary | ICD-10-CM | POA: Diagnosis not present

## 2022-08-05 DIAGNOSIS — F331 Major depressive disorder, recurrent, moderate: Secondary | ICD-10-CM | POA: Diagnosis not present

## 2022-08-05 DIAGNOSIS — Z79891 Long term (current) use of opiate analgesic: Secondary | ICD-10-CM | POA: Diagnosis not present

## 2022-08-05 DIAGNOSIS — Z6833 Body mass index (BMI) 33.0-33.9, adult: Secondary | ICD-10-CM | POA: Diagnosis not present

## 2022-08-05 DIAGNOSIS — F1120 Opioid dependence, uncomplicated: Secondary | ICD-10-CM | POA: Diagnosis not present

## 2022-08-05 DIAGNOSIS — F411 Generalized anxiety disorder: Secondary | ICD-10-CM | POA: Diagnosis not present

## 2022-08-26 DIAGNOSIS — F331 Major depressive disorder, recurrent, moderate: Secondary | ICD-10-CM | POA: Diagnosis not present

## 2022-08-26 DIAGNOSIS — F1120 Opioid dependence, uncomplicated: Secondary | ICD-10-CM | POA: Diagnosis not present

## 2022-08-26 DIAGNOSIS — F411 Generalized anxiety disorder: Secondary | ICD-10-CM | POA: Diagnosis not present

## 2022-09-03 DIAGNOSIS — G4733 Obstructive sleep apnea (adult) (pediatric): Secondary | ICD-10-CM | POA: Diagnosis not present

## 2022-09-16 DIAGNOSIS — F331 Major depressive disorder, recurrent, moderate: Secondary | ICD-10-CM | POA: Diagnosis not present

## 2022-09-16 DIAGNOSIS — Z79891 Long term (current) use of opiate analgesic: Secondary | ICD-10-CM | POA: Diagnosis not present

## 2022-09-16 DIAGNOSIS — F411 Generalized anxiety disorder: Secondary | ICD-10-CM | POA: Diagnosis not present

## 2022-09-16 DIAGNOSIS — Z6833 Body mass index (BMI) 33.0-33.9, adult: Secondary | ICD-10-CM | POA: Diagnosis not present

## 2022-09-16 DIAGNOSIS — F1120 Opioid dependence, uncomplicated: Secondary | ICD-10-CM | POA: Diagnosis not present

## 2022-09-23 DIAGNOSIS — M5136 Other intervertebral disc degeneration, lumbar region: Secondary | ICD-10-CM | POA: Diagnosis not present

## 2022-09-23 DIAGNOSIS — M5441 Lumbago with sciatica, right side: Secondary | ICD-10-CM | POA: Diagnosis not present

## 2022-09-23 DIAGNOSIS — G894 Chronic pain syndrome: Secondary | ICD-10-CM | POA: Diagnosis not present

## 2022-10-07 DIAGNOSIS — F1120 Opioid dependence, uncomplicated: Secondary | ICD-10-CM | POA: Diagnosis not present

## 2022-10-07 DIAGNOSIS — F411 Generalized anxiety disorder: Secondary | ICD-10-CM | POA: Diagnosis not present

## 2022-10-07 DIAGNOSIS — F331 Major depressive disorder, recurrent, moderate: Secondary | ICD-10-CM | POA: Diagnosis not present

## 2022-10-19 DIAGNOSIS — G4730 Sleep apnea, unspecified: Secondary | ICD-10-CM | POA: Diagnosis not present

## 2022-10-19 DIAGNOSIS — Z79899 Other long term (current) drug therapy: Secondary | ICD-10-CM | POA: Diagnosis not present

## 2022-10-19 DIAGNOSIS — Z91030 Bee allergy status: Secondary | ICD-10-CM | POA: Diagnosis not present

## 2022-10-19 DIAGNOSIS — Z885 Allergy status to narcotic agent status: Secondary | ICD-10-CM | POA: Diagnosis not present

## 2022-10-19 DIAGNOSIS — Z9989 Dependence on other enabling machines and devices: Secondary | ICD-10-CM | POA: Diagnosis not present

## 2022-10-19 DIAGNOSIS — U071 COVID-19: Secondary | ICD-10-CM | POA: Diagnosis not present

## 2022-10-19 DIAGNOSIS — Z886 Allergy status to analgesic agent status: Secondary | ICD-10-CM | POA: Diagnosis not present

## 2022-10-19 DIAGNOSIS — R0789 Other chest pain: Secondary | ICD-10-CM | POA: Diagnosis not present

## 2022-10-19 DIAGNOSIS — F17210 Nicotine dependence, cigarettes, uncomplicated: Secondary | ICD-10-CM | POA: Diagnosis not present

## 2022-10-19 DIAGNOSIS — R079 Chest pain, unspecified: Secondary | ICD-10-CM | POA: Diagnosis not present

## 2022-10-28 DIAGNOSIS — F1120 Opioid dependence, uncomplicated: Secondary | ICD-10-CM | POA: Diagnosis not present

## 2022-10-28 DIAGNOSIS — F331 Major depressive disorder, recurrent, moderate: Secondary | ICD-10-CM | POA: Diagnosis not present

## 2022-10-28 DIAGNOSIS — F411 Generalized anxiety disorder: Secondary | ICD-10-CM | POA: Diagnosis not present

## 2022-11-03 DIAGNOSIS — F17219 Nicotine dependence, cigarettes, with unspecified nicotine-induced disorders: Secondary | ICD-10-CM | POA: Diagnosis not present

## 2022-11-03 DIAGNOSIS — E785 Hyperlipidemia, unspecified: Secondary | ICD-10-CM | POA: Diagnosis not present

## 2022-11-03 DIAGNOSIS — R079 Chest pain, unspecified: Secondary | ICD-10-CM | POA: Diagnosis not present

## 2022-11-10 DIAGNOSIS — L578 Other skin changes due to chronic exposure to nonionizing radiation: Secondary | ICD-10-CM | POA: Diagnosis not present

## 2022-11-10 DIAGNOSIS — L304 Erythema intertrigo: Secondary | ICD-10-CM | POA: Diagnosis not present

## 2022-11-10 DIAGNOSIS — L821 Other seborrheic keratosis: Secondary | ICD-10-CM | POA: Diagnosis not present

## 2022-11-10 DIAGNOSIS — C441222 Squamous cell carcinoma of skin of right lower eyelid, including canthus: Secondary | ICD-10-CM | POA: Diagnosis not present

## 2022-11-10 DIAGNOSIS — L2089 Other atopic dermatitis: Secondary | ICD-10-CM | POA: Diagnosis not present

## 2022-11-10 DIAGNOSIS — D485 Neoplasm of uncertain behavior of skin: Secondary | ICD-10-CM | POA: Diagnosis not present

## 2022-11-10 DIAGNOSIS — L820 Inflamed seborrheic keratosis: Secondary | ICD-10-CM | POA: Diagnosis not present

## 2022-11-11 DIAGNOSIS — F411 Generalized anxiety disorder: Secondary | ICD-10-CM | POA: Diagnosis not present

## 2022-11-11 DIAGNOSIS — F331 Major depressive disorder, recurrent, moderate: Secondary | ICD-10-CM | POA: Diagnosis not present

## 2022-11-11 DIAGNOSIS — J309 Allergic rhinitis, unspecified: Secondary | ICD-10-CM | POA: Diagnosis not present

## 2022-11-11 DIAGNOSIS — J4520 Mild intermittent asthma, uncomplicated: Secondary | ICD-10-CM | POA: Diagnosis not present

## 2022-11-11 DIAGNOSIS — F1120 Opioid dependence, uncomplicated: Secondary | ICD-10-CM | POA: Diagnosis not present

## 2022-11-11 DIAGNOSIS — J302 Other seasonal allergic rhinitis: Secondary | ICD-10-CM | POA: Diagnosis not present

## 2022-11-11 DIAGNOSIS — Z1330 Encounter for screening examination for mental health and behavioral disorders, unspecified: Secondary | ICD-10-CM | POA: Diagnosis not present

## 2022-11-18 DIAGNOSIS — R5383 Other fatigue: Secondary | ICD-10-CM | POA: Diagnosis not present

## 2022-11-18 DIAGNOSIS — G4733 Obstructive sleep apnea (adult) (pediatric): Secondary | ICD-10-CM | POA: Diagnosis not present

## 2022-11-18 DIAGNOSIS — M549 Dorsalgia, unspecified: Secondary | ICD-10-CM | POA: Diagnosis not present

## 2022-11-18 DIAGNOSIS — E7849 Other hyperlipidemia: Secondary | ICD-10-CM | POA: Diagnosis not present

## 2022-11-18 DIAGNOSIS — F17219 Nicotine dependence, cigarettes, with unspecified nicotine-induced disorders: Secondary | ICD-10-CM | POA: Diagnosis not present

## 2022-11-18 DIAGNOSIS — F331 Major depressive disorder, recurrent, moderate: Secondary | ICD-10-CM | POA: Diagnosis not present

## 2022-11-18 DIAGNOSIS — F1121 Opioid dependence, in remission: Secondary | ICD-10-CM | POA: Diagnosis not present

## 2022-11-18 DIAGNOSIS — J309 Allergic rhinitis, unspecified: Secondary | ICD-10-CM | POA: Diagnosis not present

## 2022-11-25 DIAGNOSIS — F1120 Opioid dependence, uncomplicated: Secondary | ICD-10-CM | POA: Diagnosis not present

## 2022-11-25 DIAGNOSIS — F331 Major depressive disorder, recurrent, moderate: Secondary | ICD-10-CM | POA: Diagnosis not present

## 2022-11-25 DIAGNOSIS — F411 Generalized anxiety disorder: Secondary | ICD-10-CM | POA: Diagnosis not present

## 2022-11-27 DIAGNOSIS — J309 Allergic rhinitis, unspecified: Secondary | ICD-10-CM | POA: Diagnosis not present

## 2022-11-27 DIAGNOSIS — J329 Chronic sinusitis, unspecified: Secondary | ICD-10-CM | POA: Diagnosis not present

## 2022-11-30 DIAGNOSIS — R079 Chest pain, unspecified: Secondary | ICD-10-CM | POA: Diagnosis not present

## 2022-12-02 DIAGNOSIS — M5136 Other intervertebral disc degeneration, lumbar region: Secondary | ICD-10-CM | POA: Diagnosis not present

## 2022-12-02 DIAGNOSIS — M5441 Lumbago with sciatica, right side: Secondary | ICD-10-CM | POA: Diagnosis not present

## 2022-12-02 DIAGNOSIS — G894 Chronic pain syndrome: Secondary | ICD-10-CM | POA: Diagnosis not present

## 2022-12-10 DIAGNOSIS — M5136 Other intervertebral disc degeneration, lumbar region: Secondary | ICD-10-CM | POA: Diagnosis not present

## 2022-12-10 DIAGNOSIS — G894 Chronic pain syndrome: Secondary | ICD-10-CM | POA: Diagnosis not present

## 2022-12-10 DIAGNOSIS — M5441 Lumbago with sciatica, right side: Secondary | ICD-10-CM | POA: Diagnosis not present

## 2022-12-14 DIAGNOSIS — M5136 Other intervertebral disc degeneration, lumbar region: Secondary | ICD-10-CM | POA: Diagnosis not present

## 2022-12-14 DIAGNOSIS — G894 Chronic pain syndrome: Secondary | ICD-10-CM | POA: Diagnosis not present

## 2022-12-14 DIAGNOSIS — M5441 Lumbago with sciatica, right side: Secondary | ICD-10-CM | POA: Diagnosis not present

## 2023-01-05 DIAGNOSIS — R079 Chest pain, unspecified: Secondary | ICD-10-CM | POA: Diagnosis not present

## 2023-01-06 DIAGNOSIS — F1120 Opioid dependence, uncomplicated: Secondary | ICD-10-CM | POA: Diagnosis not present

## 2023-01-06 DIAGNOSIS — F331 Major depressive disorder, recurrent, moderate: Secondary | ICD-10-CM | POA: Diagnosis not present

## 2023-01-06 DIAGNOSIS — Z6835 Body mass index (BMI) 35.0-35.9, adult: Secondary | ICD-10-CM | POA: Diagnosis not present

## 2023-01-06 DIAGNOSIS — F411 Generalized anxiety disorder: Secondary | ICD-10-CM | POA: Diagnosis not present

## 2023-01-07 DIAGNOSIS — C441122 Basal cell carcinoma of skin of right lower eyelid, including canthus: Secondary | ICD-10-CM | POA: Diagnosis not present

## 2023-02-03 DIAGNOSIS — F331 Major depressive disorder, recurrent, moderate: Secondary | ICD-10-CM | POA: Diagnosis not present

## 2023-02-03 DIAGNOSIS — F1120 Opioid dependence, uncomplicated: Secondary | ICD-10-CM | POA: Diagnosis not present

## 2023-02-03 DIAGNOSIS — F411 Generalized anxiety disorder: Secondary | ICD-10-CM | POA: Diagnosis not present

## 2023-02-06 DIAGNOSIS — K760 Fatty (change of) liver, not elsewhere classified: Secondary | ICD-10-CM | POA: Diagnosis not present

## 2023-02-06 DIAGNOSIS — K3189 Other diseases of stomach and duodenum: Secondary | ICD-10-CM | POA: Diagnosis not present

## 2023-02-06 DIAGNOSIS — K2289 Other specified disease of esophagus: Secondary | ICD-10-CM | POA: Diagnosis not present

## 2023-02-06 DIAGNOSIS — F17210 Nicotine dependence, cigarettes, uncomplicated: Secondary | ICD-10-CM | POA: Diagnosis not present

## 2023-02-06 DIAGNOSIS — K2900 Acute gastritis without bleeding: Secondary | ICD-10-CM | POA: Diagnosis not present

## 2023-02-06 DIAGNOSIS — N281 Cyst of kidney, acquired: Secondary | ICD-10-CM | POA: Diagnosis not present

## 2023-02-06 DIAGNOSIS — K2970 Gastritis, unspecified, without bleeding: Secondary | ICD-10-CM | POA: Diagnosis not present

## 2023-02-24 DIAGNOSIS — Z6835 Body mass index (BMI) 35.0-35.9, adult: Secondary | ICD-10-CM | POA: Diagnosis not present

## 2023-02-24 DIAGNOSIS — F331 Major depressive disorder, recurrent, moderate: Secondary | ICD-10-CM | POA: Diagnosis not present

## 2023-02-24 DIAGNOSIS — F1120 Opioid dependence, uncomplicated: Secondary | ICD-10-CM | POA: Diagnosis not present

## 2023-02-24 DIAGNOSIS — F411 Generalized anxiety disorder: Secondary | ICD-10-CM | POA: Diagnosis not present

## 2023-03-04 DIAGNOSIS — K2900 Acute gastritis without bleeding: Secondary | ICD-10-CM | POA: Diagnosis not present

## 2023-03-04 DIAGNOSIS — W57XXXA Bitten or stung by nonvenomous insect and other nonvenomous arthropods, initial encounter: Secondary | ICD-10-CM | POA: Diagnosis not present

## 2023-03-17 DIAGNOSIS — F1120 Opioid dependence, uncomplicated: Secondary | ICD-10-CM | POA: Diagnosis not present

## 2023-03-17 DIAGNOSIS — F331 Major depressive disorder, recurrent, moderate: Secondary | ICD-10-CM | POA: Diagnosis not present

## 2023-03-17 DIAGNOSIS — F411 Generalized anxiety disorder: Secondary | ICD-10-CM | POA: Diagnosis not present

## 2023-03-18 DIAGNOSIS — R110 Nausea: Secondary | ICD-10-CM | POA: Diagnosis not present

## 2023-03-18 DIAGNOSIS — R935 Abnormal findings on diagnostic imaging of other abdominal regions, including retroperitoneum: Secondary | ICD-10-CM | POA: Diagnosis not present

## 2023-03-18 DIAGNOSIS — K219 Gastro-esophageal reflux disease without esophagitis: Secondary | ICD-10-CM | POA: Diagnosis not present

## 2023-03-18 DIAGNOSIS — R1012 Left upper quadrant pain: Secondary | ICD-10-CM | POA: Diagnosis not present

## 2023-03-18 DIAGNOSIS — R1011 Right upper quadrant pain: Secondary | ICD-10-CM | POA: Diagnosis not present

## 2023-03-18 DIAGNOSIS — K5909 Other constipation: Secondary | ICD-10-CM | POA: Diagnosis not present

## 2023-03-18 DIAGNOSIS — Z86010 Personal history of colonic polyps: Secondary | ICD-10-CM | POA: Diagnosis not present

## 2023-04-07 DIAGNOSIS — F1120 Opioid dependence, uncomplicated: Secondary | ICD-10-CM | POA: Diagnosis not present

## 2023-04-07 DIAGNOSIS — F331 Major depressive disorder, recurrent, moderate: Secondary | ICD-10-CM | POA: Diagnosis not present

## 2023-04-07 DIAGNOSIS — F411 Generalized anxiety disorder: Secondary | ICD-10-CM | POA: Diagnosis not present

## 2023-04-07 DIAGNOSIS — Z6835 Body mass index (BMI) 35.0-35.9, adult: Secondary | ICD-10-CM | POA: Diagnosis not present

## 2023-04-13 DIAGNOSIS — K219 Gastro-esophageal reflux disease without esophagitis: Secondary | ICD-10-CM | POA: Diagnosis not present

## 2023-04-13 DIAGNOSIS — R1011 Right upper quadrant pain: Secondary | ICD-10-CM | POA: Diagnosis not present

## 2023-04-13 DIAGNOSIS — K449 Diaphragmatic hernia without obstruction or gangrene: Secondary | ICD-10-CM | POA: Diagnosis not present

## 2023-04-13 DIAGNOSIS — K760 Fatty (change of) liver, not elsewhere classified: Secondary | ICD-10-CM | POA: Diagnosis not present

## 2023-04-13 DIAGNOSIS — R1012 Left upper quadrant pain: Secondary | ICD-10-CM | POA: Diagnosis not present

## 2023-04-21 DIAGNOSIS — F411 Generalized anxiety disorder: Secondary | ICD-10-CM | POA: Diagnosis not present

## 2023-04-21 DIAGNOSIS — F1120 Opioid dependence, uncomplicated: Secondary | ICD-10-CM | POA: Diagnosis not present

## 2023-04-21 DIAGNOSIS — F331 Major depressive disorder, recurrent, moderate: Secondary | ICD-10-CM | POA: Diagnosis not present

## 2023-05-06 DIAGNOSIS — Z6835 Body mass index (BMI) 35.0-35.9, adult: Secondary | ICD-10-CM | POA: Diagnosis not present

## 2023-05-06 DIAGNOSIS — F331 Major depressive disorder, recurrent, moderate: Secondary | ICD-10-CM | POA: Diagnosis not present

## 2023-05-06 DIAGNOSIS — F411 Generalized anxiety disorder: Secondary | ICD-10-CM | POA: Diagnosis not present

## 2023-05-06 DIAGNOSIS — F1120 Opioid dependence, uncomplicated: Secondary | ICD-10-CM | POA: Diagnosis not present

## 2023-05-14 DIAGNOSIS — E559 Vitamin D deficiency, unspecified: Secondary | ICD-10-CM | POA: Diagnosis not present

## 2023-05-14 DIAGNOSIS — Z125 Encounter for screening for malignant neoplasm of prostate: Secondary | ICD-10-CM | POA: Diagnosis not present

## 2023-05-14 DIAGNOSIS — E7849 Other hyperlipidemia: Secondary | ICD-10-CM | POA: Diagnosis not present

## 2023-05-14 DIAGNOSIS — K219 Gastro-esophageal reflux disease without esophagitis: Secondary | ICD-10-CM | POA: Diagnosis not present

## 2023-05-18 DIAGNOSIS — R110 Nausea: Secondary | ICD-10-CM | POA: Diagnosis not present

## 2023-05-18 DIAGNOSIS — K5909 Other constipation: Secondary | ICD-10-CM | POA: Diagnosis not present

## 2023-05-18 DIAGNOSIS — K219 Gastro-esophageal reflux disease without esophagitis: Secondary | ICD-10-CM | POA: Diagnosis not present

## 2023-05-21 DIAGNOSIS — R41840 Attention and concentration deficit: Secondary | ICD-10-CM | POA: Diagnosis not present

## 2023-05-21 DIAGNOSIS — F17210 Nicotine dependence, cigarettes, uncomplicated: Secondary | ICD-10-CM | POA: Diagnosis not present

## 2023-05-21 DIAGNOSIS — Z599 Problem related to housing and economic circumstances, unspecified: Secondary | ICD-10-CM | POA: Diagnosis not present

## 2023-05-21 DIAGNOSIS — E559 Vitamin D deficiency, unspecified: Secondary | ICD-10-CM | POA: Diagnosis not present

## 2023-05-21 DIAGNOSIS — E66811 Obesity, class 1: Secondary | ICD-10-CM | POA: Diagnosis not present

## 2023-05-21 DIAGNOSIS — K219 Gastro-esophageal reflux disease without esophagitis: Secondary | ICD-10-CM | POA: Diagnosis not present

## 2023-05-21 DIAGNOSIS — Z6830 Body mass index (BMI) 30.0-30.9, adult: Secondary | ICD-10-CM | POA: Diagnosis not present

## 2023-05-21 DIAGNOSIS — E7849 Other hyperlipidemia: Secondary | ICD-10-CM | POA: Diagnosis not present

## 2023-05-21 DIAGNOSIS — E6609 Other obesity due to excess calories: Secondary | ICD-10-CM | POA: Diagnosis not present

## 2023-05-21 DIAGNOSIS — Z0000 Encounter for general adult medical examination without abnormal findings: Secondary | ICD-10-CM | POA: Diagnosis not present

## 2023-06-03 DIAGNOSIS — F1120 Opioid dependence, uncomplicated: Secondary | ICD-10-CM | POA: Diagnosis not present

## 2023-06-03 DIAGNOSIS — F411 Generalized anxiety disorder: Secondary | ICD-10-CM | POA: Diagnosis not present

## 2023-06-03 DIAGNOSIS — F331 Major depressive disorder, recurrent, moderate: Secondary | ICD-10-CM | POA: Diagnosis not present

## 2023-07-06 DIAGNOSIS — F331 Major depressive disorder, recurrent, moderate: Secondary | ICD-10-CM | POA: Diagnosis not present

## 2023-07-06 DIAGNOSIS — F1120 Opioid dependence, uncomplicated: Secondary | ICD-10-CM | POA: Diagnosis not present

## 2023-07-06 DIAGNOSIS — F411 Generalized anxiety disorder: Secondary | ICD-10-CM | POA: Diagnosis not present

## 2023-07-06 DIAGNOSIS — Z6835 Body mass index (BMI) 35.0-35.9, adult: Secondary | ICD-10-CM | POA: Diagnosis not present

## 2023-08-03 DIAGNOSIS — F1120 Opioid dependence, uncomplicated: Secondary | ICD-10-CM | POA: Diagnosis not present

## 2023-08-03 DIAGNOSIS — F411 Generalized anxiety disorder: Secondary | ICD-10-CM | POA: Diagnosis not present

## 2023-08-03 DIAGNOSIS — F331 Major depressive disorder, recurrent, moderate: Secondary | ICD-10-CM | POA: Diagnosis not present

## 2023-08-24 DIAGNOSIS — Z23 Encounter for immunization: Secondary | ICD-10-CM | POA: Diagnosis not present

## 2023-08-24 DIAGNOSIS — L03115 Cellulitis of right lower limb: Secondary | ICD-10-CM | POA: Diagnosis not present

## 2023-08-31 DIAGNOSIS — F331 Major depressive disorder, recurrent, moderate: Secondary | ICD-10-CM | POA: Diagnosis not present

## 2023-08-31 DIAGNOSIS — F411 Generalized anxiety disorder: Secondary | ICD-10-CM | POA: Diagnosis not present

## 2023-08-31 DIAGNOSIS — F1120 Opioid dependence, uncomplicated: Secondary | ICD-10-CM | POA: Diagnosis not present

## 2023-08-31 DIAGNOSIS — Z6835 Body mass index (BMI) 35.0-35.9, adult: Secondary | ICD-10-CM | POA: Diagnosis not present

## 2023-09-21 DIAGNOSIS — F331 Major depressive disorder, recurrent, moderate: Secondary | ICD-10-CM | POA: Diagnosis not present

## 2023-09-21 DIAGNOSIS — F1120 Opioid dependence, uncomplicated: Secondary | ICD-10-CM | POA: Diagnosis not present

## 2023-09-21 DIAGNOSIS — F411 Generalized anxiety disorder: Secondary | ICD-10-CM | POA: Diagnosis not present

## 2023-10-08 DIAGNOSIS — R110 Nausea: Secondary | ICD-10-CM | POA: Diagnosis not present

## 2023-10-08 DIAGNOSIS — K219 Gastro-esophageal reflux disease without esophagitis: Secondary | ICD-10-CM | POA: Diagnosis not present

## 2023-10-08 DIAGNOSIS — K5909 Other constipation: Secondary | ICD-10-CM | POA: Diagnosis not present

## 2023-10-12 DIAGNOSIS — F1120 Opioid dependence, uncomplicated: Secondary | ICD-10-CM | POA: Diagnosis not present

## 2023-10-12 DIAGNOSIS — E669 Obesity, unspecified: Secondary | ICD-10-CM | POA: Diagnosis not present

## 2023-10-12 DIAGNOSIS — F411 Generalized anxiety disorder: Secondary | ICD-10-CM | POA: Diagnosis not present

## 2023-10-12 DIAGNOSIS — Z6835 Body mass index (BMI) 35.0-35.9, adult: Secondary | ICD-10-CM | POA: Diagnosis not present

## 2023-10-12 DIAGNOSIS — F331 Major depressive disorder, recurrent, moderate: Secondary | ICD-10-CM | POA: Diagnosis not present

## 2023-11-02 DIAGNOSIS — F331 Major depressive disorder, recurrent, moderate: Secondary | ICD-10-CM | POA: Diagnosis not present

## 2023-11-02 DIAGNOSIS — F411 Generalized anxiety disorder: Secondary | ICD-10-CM | POA: Diagnosis not present

## 2023-11-02 DIAGNOSIS — F1120 Opioid dependence, uncomplicated: Secondary | ICD-10-CM | POA: Diagnosis not present

## 2023-11-11 DIAGNOSIS — L821 Other seborrheic keratosis: Secondary | ICD-10-CM | POA: Diagnosis not present

## 2023-11-11 DIAGNOSIS — L578 Other skin changes due to chronic exposure to nonionizing radiation: Secondary | ICD-10-CM | POA: Diagnosis not present

## 2023-11-11 DIAGNOSIS — Z85828 Personal history of other malignant neoplasm of skin: Secondary | ICD-10-CM | POA: Diagnosis not present

## 2023-11-11 DIAGNOSIS — D485 Neoplasm of uncertain behavior of skin: Secondary | ICD-10-CM | POA: Diagnosis not present

## 2023-11-11 DIAGNOSIS — D2361 Other benign neoplasm of skin of right upper limb, including shoulder: Secondary | ICD-10-CM | POA: Diagnosis not present

## 2023-11-11 DIAGNOSIS — L814 Other melanin hyperpigmentation: Secondary | ICD-10-CM | POA: Diagnosis not present

## 2023-11-19 DIAGNOSIS — K219 Gastro-esophageal reflux disease without esophagitis: Secondary | ICD-10-CM | POA: Diagnosis not present

## 2023-11-19 DIAGNOSIS — E7849 Other hyperlipidemia: Secondary | ICD-10-CM | POA: Diagnosis not present

## 2023-11-19 DIAGNOSIS — E559 Vitamin D deficiency, unspecified: Secondary | ICD-10-CM | POA: Diagnosis not present

## 2023-11-19 DIAGNOSIS — F40248 Other situational type phobia: Secondary | ICD-10-CM | POA: Diagnosis not present

## 2023-11-19 DIAGNOSIS — F331 Major depressive disorder, recurrent, moderate: Secondary | ICD-10-CM | POA: Diagnosis not present

## 2023-11-19 DIAGNOSIS — F411 Generalized anxiety disorder: Secondary | ICD-10-CM | POA: Diagnosis not present

## 2023-11-23 DIAGNOSIS — Z6835 Body mass index (BMI) 35.0-35.9, adult: Secondary | ICD-10-CM | POA: Diagnosis not present

## 2023-11-23 DIAGNOSIS — F1120 Opioid dependence, uncomplicated: Secondary | ICD-10-CM | POA: Diagnosis not present

## 2023-11-23 DIAGNOSIS — F411 Generalized anxiety disorder: Secondary | ICD-10-CM | POA: Diagnosis not present

## 2023-11-23 DIAGNOSIS — F331 Major depressive disorder, recurrent, moderate: Secondary | ICD-10-CM | POA: Diagnosis not present

## 2023-11-26 DIAGNOSIS — E7849 Other hyperlipidemia: Secondary | ICD-10-CM | POA: Diagnosis not present

## 2023-11-26 DIAGNOSIS — K219 Gastro-esophageal reflux disease without esophagitis: Secondary | ICD-10-CM | POA: Diagnosis not present

## 2023-11-26 DIAGNOSIS — E559 Vitamin D deficiency, unspecified: Secondary | ICD-10-CM | POA: Diagnosis not present

## 2023-11-26 DIAGNOSIS — F331 Major depressive disorder, recurrent, moderate: Secondary | ICD-10-CM | POA: Diagnosis not present

## 2023-11-26 DIAGNOSIS — F17210 Nicotine dependence, cigarettes, uncomplicated: Secondary | ICD-10-CM | POA: Diagnosis not present

## 2023-11-26 DIAGNOSIS — Z125 Encounter for screening for malignant neoplasm of prostate: Secondary | ICD-10-CM | POA: Diagnosis not present

## 2023-11-26 DIAGNOSIS — R7309 Other abnormal glucose: Secondary | ICD-10-CM | POA: Diagnosis not present

## 2023-11-26 DIAGNOSIS — F1121 Opioid dependence, in remission: Secondary | ICD-10-CM | POA: Diagnosis not present

## 2024-01-18 DIAGNOSIS — F331 Major depressive disorder, recurrent, moderate: Secondary | ICD-10-CM | POA: Diagnosis not present

## 2024-01-18 DIAGNOSIS — F1120 Opioid dependence, uncomplicated: Secondary | ICD-10-CM | POA: Diagnosis not present

## 2024-01-18 DIAGNOSIS — F411 Generalized anxiety disorder: Secondary | ICD-10-CM | POA: Diagnosis not present

## 2024-02-01 DIAGNOSIS — G4733 Obstructive sleep apnea (adult) (pediatric): Secondary | ICD-10-CM | POA: Diagnosis not present

## 2024-02-01 DIAGNOSIS — R0982 Postnasal drip: Secondary | ICD-10-CM | POA: Diagnosis not present

## 2024-02-01 DIAGNOSIS — F17210 Nicotine dependence, cigarettes, uncomplicated: Secondary | ICD-10-CM | POA: Diagnosis not present

## 2024-02-14 DIAGNOSIS — K219 Gastro-esophageal reflux disease without esophagitis: Secondary | ICD-10-CM | POA: Diagnosis not present

## 2024-02-14 DIAGNOSIS — K581 Irritable bowel syndrome with constipation: Secondary | ICD-10-CM | POA: Diagnosis not present

## 2024-02-15 DIAGNOSIS — F411 Generalized anxiety disorder: Secondary | ICD-10-CM | POA: Diagnosis not present

## 2024-02-15 DIAGNOSIS — Z6836 Body mass index (BMI) 36.0-36.9, adult: Secondary | ICD-10-CM | POA: Diagnosis not present

## 2024-02-15 DIAGNOSIS — F331 Major depressive disorder, recurrent, moderate: Secondary | ICD-10-CM | POA: Diagnosis not present

## 2024-02-15 DIAGNOSIS — F1120 Opioid dependence, uncomplicated: Secondary | ICD-10-CM | POA: Diagnosis not present

## 2024-02-17 DIAGNOSIS — L820 Inflamed seborrheic keratosis: Secondary | ICD-10-CM | POA: Diagnosis not present

## 2024-03-14 DIAGNOSIS — R1031 Right lower quadrant pain: Secondary | ICD-10-CM | POA: Diagnosis not present

## 2024-03-14 DIAGNOSIS — F411 Generalized anxiety disorder: Secondary | ICD-10-CM | POA: Diagnosis not present

## 2024-03-14 DIAGNOSIS — F1120 Opioid dependence, uncomplicated: Secondary | ICD-10-CM | POA: Diagnosis not present

## 2024-03-14 DIAGNOSIS — N433 Hydrocele, unspecified: Secondary | ICD-10-CM | POA: Diagnosis not present

## 2024-03-14 DIAGNOSIS — F331 Major depressive disorder, recurrent, moderate: Secondary | ICD-10-CM | POA: Diagnosis not present

## 2024-03-14 DIAGNOSIS — G8929 Other chronic pain: Secondary | ICD-10-CM | POA: Diagnosis not present

## 2024-03-14 DIAGNOSIS — R300 Dysuria: Secondary | ICD-10-CM | POA: Diagnosis not present

## 2024-03-14 DIAGNOSIS — M545 Low back pain, unspecified: Secondary | ICD-10-CM | POA: Diagnosis not present

## 2024-04-11 DIAGNOSIS — F331 Major depressive disorder, recurrent, moderate: Secondary | ICD-10-CM | POA: Diagnosis not present

## 2024-04-11 DIAGNOSIS — F411 Generalized anxiety disorder: Secondary | ICD-10-CM | POA: Diagnosis not present

## 2024-04-11 DIAGNOSIS — F1120 Opioid dependence, uncomplicated: Secondary | ICD-10-CM | POA: Diagnosis not present

## 2024-05-08 DIAGNOSIS — Z87891 Personal history of nicotine dependence: Secondary | ICD-10-CM | POA: Diagnosis not present

## 2024-05-09 DIAGNOSIS — F1120 Opioid dependence, uncomplicated: Secondary | ICD-10-CM | POA: Diagnosis not present

## 2024-05-09 DIAGNOSIS — F331 Major depressive disorder, recurrent, moderate: Secondary | ICD-10-CM | POA: Diagnosis not present

## 2024-05-09 DIAGNOSIS — F411 Generalized anxiety disorder: Secondary | ICD-10-CM | POA: Diagnosis not present

## 2024-05-09 DIAGNOSIS — Z6836 Body mass index (BMI) 36.0-36.9, adult: Secondary | ICD-10-CM | POA: Diagnosis not present

## 2024-05-16 DIAGNOSIS — Z1330 Encounter for screening examination for mental health and behavioral disorders, unspecified: Secondary | ICD-10-CM | POA: Diagnosis not present

## 2024-05-16 DIAGNOSIS — G4733 Obstructive sleep apnea (adult) (pediatric): Secondary | ICD-10-CM | POA: Diagnosis not present

## 2024-05-16 DIAGNOSIS — E7849 Other hyperlipidemia: Secondary | ICD-10-CM | POA: Diagnosis not present

## 2024-05-16 DIAGNOSIS — K219 Gastro-esophageal reflux disease without esophagitis: Secondary | ICD-10-CM | POA: Diagnosis not present

## 2024-05-16 DIAGNOSIS — R7309 Other abnormal glucose: Secondary | ICD-10-CM | POA: Diagnosis not present

## 2024-05-16 DIAGNOSIS — F17210 Nicotine dependence, cigarettes, uncomplicated: Secondary | ICD-10-CM | POA: Diagnosis not present

## 2024-05-16 DIAGNOSIS — Z79899 Other long term (current) drug therapy: Secondary | ICD-10-CM | POA: Diagnosis not present

## 2024-05-16 DIAGNOSIS — E559 Vitamin D deficiency, unspecified: Secondary | ICD-10-CM | POA: Diagnosis not present

## 2024-05-18 DIAGNOSIS — K5909 Other constipation: Secondary | ICD-10-CM | POA: Diagnosis not present

## 2024-05-18 DIAGNOSIS — K581 Irritable bowel syndrome with constipation: Secondary | ICD-10-CM | POA: Diagnosis not present

## 2024-05-18 DIAGNOSIS — K219 Gastro-esophageal reflux disease without esophagitis: Secondary | ICD-10-CM | POA: Diagnosis not present

## 2024-05-18 DIAGNOSIS — R110 Nausea: Secondary | ICD-10-CM | POA: Diagnosis not present

## 2024-05-18 DIAGNOSIS — R1084 Generalized abdominal pain: Secondary | ICD-10-CM | POA: Diagnosis not present

## 2024-05-23 DIAGNOSIS — I2510 Atherosclerotic heart disease of native coronary artery without angina pectoris: Secondary | ICD-10-CM | POA: Diagnosis not present

## 2024-05-23 DIAGNOSIS — R002 Palpitations: Secondary | ICD-10-CM | POA: Diagnosis not present

## 2024-05-23 DIAGNOSIS — R079 Chest pain, unspecified: Secondary | ICD-10-CM | POA: Diagnosis not present

## 2024-05-23 DIAGNOSIS — E7849 Other hyperlipidemia: Secondary | ICD-10-CM | POA: Diagnosis not present

## 2024-05-23 DIAGNOSIS — Z720 Tobacco use: Secondary | ICD-10-CM | POA: Diagnosis not present

## 2024-05-23 DIAGNOSIS — F17210 Nicotine dependence, cigarettes, uncomplicated: Secondary | ICD-10-CM | POA: Diagnosis not present

## 2024-05-24 DIAGNOSIS — R079 Chest pain, unspecified: Secondary | ICD-10-CM | POA: Diagnosis not present

## 2024-05-24 DIAGNOSIS — I2510 Atherosclerotic heart disease of native coronary artery without angina pectoris: Secondary | ICD-10-CM | POA: Diagnosis not present

## 2024-05-24 DIAGNOSIS — R002 Palpitations: Secondary | ICD-10-CM | POA: Diagnosis not present

## 2024-06-06 DIAGNOSIS — F411 Generalized anxiety disorder: Secondary | ICD-10-CM | POA: Diagnosis not present

## 2024-06-06 DIAGNOSIS — F331 Major depressive disorder, recurrent, moderate: Secondary | ICD-10-CM | POA: Diagnosis not present

## 2024-06-06 DIAGNOSIS — F1120 Opioid dependence, uncomplicated: Secondary | ICD-10-CM | POA: Diagnosis not present

## 2024-07-03 DIAGNOSIS — R079 Chest pain, unspecified: Secondary | ICD-10-CM | POA: Diagnosis not present

## 2024-07-03 DIAGNOSIS — I2510 Atherosclerotic heart disease of native coronary artery without angina pectoris: Secondary | ICD-10-CM | POA: Diagnosis not present

## 2024-07-04 DIAGNOSIS — F1120 Opioid dependence, uncomplicated: Secondary | ICD-10-CM | POA: Diagnosis not present

## 2024-07-04 DIAGNOSIS — F411 Generalized anxiety disorder: Secondary | ICD-10-CM | POA: Diagnosis not present

## 2024-07-04 DIAGNOSIS — Z6837 Body mass index (BMI) 37.0-37.9, adult: Secondary | ICD-10-CM | POA: Diagnosis not present

## 2024-07-04 DIAGNOSIS — F331 Major depressive disorder, recurrent, moderate: Secondary | ICD-10-CM | POA: Diagnosis not present
